# Patient Record
Sex: Female | Born: 1970
Health system: Southern US, Community
[De-identification: ages and names within clinical notes are randomized; demographics above are authoritative.]

## PROBLEM LIST (undated history)

## (undated) DIAGNOSIS — F32A Depression, unspecified: Secondary | ICD-10-CM

## (undated) DIAGNOSIS — E785 Hyperlipidemia, unspecified: Secondary | ICD-10-CM

## (undated) DIAGNOSIS — K219 Gastro-esophageal reflux disease without esophagitis: Secondary | ICD-10-CM

## (undated) DIAGNOSIS — E079 Disorder of thyroid, unspecified: Secondary | ICD-10-CM

## (undated) DIAGNOSIS — F329 Major depressive disorder, single episode, unspecified: Secondary | ICD-10-CM

## (undated) HISTORY — DX: Hyperlipidemia, unspecified: E78.5

## (undated) HISTORY — DX: Gastro-esophageal reflux disease without esophagitis: K21.9

---

## 2000-03-14 ENCOUNTER — Ambulatory Visit (HOSPITAL_COMMUNITY): Admission: RE | Admit: 2000-03-14 | Discharge: 2000-03-14 | Payer: Self-pay | Admitting: Hematology & Oncology

## 2000-03-14 ENCOUNTER — Encounter: Payer: Self-pay | Admitting: Hematology & Oncology

## 2000-12-05 DIAGNOSIS — C801 Malignant (primary) neoplasm, unspecified: Secondary | ICD-10-CM

## 2000-12-05 HISTORY — PX: THYROIDECTOMY: SHX17

## 2000-12-05 HISTORY — DX: Malignant (primary) neoplasm, unspecified: C80.1

## 2001-10-29 ENCOUNTER — Other Ambulatory Visit: Admission: RE | Admit: 2001-10-29 | Discharge: 2001-10-29 | Payer: Self-pay | Admitting: *Deleted

## 2001-12-05 DIAGNOSIS — Z923 Personal history of irradiation: Secondary | ICD-10-CM

## 2001-12-05 HISTORY — DX: Personal history of irradiation: Z92.3

## 2002-01-01 ENCOUNTER — Encounter (INDEPENDENT_AMBULATORY_CARE_PROVIDER_SITE_OTHER): Payer: Self-pay | Admitting: *Deleted

## 2002-01-01 ENCOUNTER — Encounter: Payer: Self-pay | Admitting: Otolaryngology

## 2002-01-01 ENCOUNTER — Ambulatory Visit (HOSPITAL_COMMUNITY): Admission: RE | Admit: 2002-01-01 | Discharge: 2002-01-01 | Payer: Self-pay | Admitting: Otolaryngology

## 2002-01-04 ENCOUNTER — Other Ambulatory Visit: Admission: RE | Admit: 2002-01-04 | Discharge: 2002-01-04 | Payer: Self-pay | Admitting: Otolaryngology

## 2002-01-07 ENCOUNTER — Encounter: Payer: Self-pay | Admitting: Otolaryngology

## 2002-01-07 ENCOUNTER — Ambulatory Visit (HOSPITAL_COMMUNITY): Admission: RE | Admit: 2002-01-07 | Discharge: 2002-01-07 | Payer: Self-pay | Admitting: Otolaryngology

## 2002-01-22 ENCOUNTER — Encounter (INDEPENDENT_AMBULATORY_CARE_PROVIDER_SITE_OTHER): Payer: Self-pay

## 2002-01-22 ENCOUNTER — Observation Stay (HOSPITAL_COMMUNITY): Admission: RE | Admit: 2002-01-22 | Discharge: 2002-01-23 | Payer: Self-pay | Admitting: Surgery

## 2002-02-11 ENCOUNTER — Ambulatory Visit (HOSPITAL_COMMUNITY): Admission: RE | Admit: 2002-02-11 | Discharge: 2002-02-11 | Payer: Self-pay | Admitting: Endocrinology

## 2002-02-14 ENCOUNTER — Encounter: Payer: Self-pay | Admitting: Endocrinology

## 2002-02-14 ENCOUNTER — Ambulatory Visit (HOSPITAL_COMMUNITY): Admission: RE | Admit: 2002-02-14 | Discharge: 2002-02-14 | Payer: Self-pay | Admitting: Endocrinology

## 2002-09-02 ENCOUNTER — Encounter (HOSPITAL_COMMUNITY): Admission: RE | Admit: 2002-09-02 | Discharge: 2002-12-01 | Payer: Self-pay | Admitting: Endocrinology

## 2002-09-06 ENCOUNTER — Encounter: Payer: Self-pay | Admitting: Endocrinology

## 2002-09-16 ENCOUNTER — Ambulatory Visit (HOSPITAL_COMMUNITY): Admission: RE | Admit: 2002-09-16 | Discharge: 2002-09-16 | Payer: Self-pay | Admitting: Endocrinology

## 2002-09-16 ENCOUNTER — Encounter: Payer: Self-pay | Admitting: Endocrinology

## 2003-02-10 ENCOUNTER — Other Ambulatory Visit: Admission: RE | Admit: 2003-02-10 | Discharge: 2003-02-10 | Payer: Self-pay | Admitting: *Deleted

## 2003-09-24 ENCOUNTER — Encounter (INDEPENDENT_AMBULATORY_CARE_PROVIDER_SITE_OTHER): Payer: Self-pay | Admitting: Specialist

## 2003-09-24 ENCOUNTER — Ambulatory Visit (HOSPITAL_COMMUNITY): Admission: RE | Admit: 2003-09-24 | Discharge: 2003-09-24 | Payer: Self-pay | Admitting: Obstetrics and Gynecology

## 2003-12-18 ENCOUNTER — Other Ambulatory Visit: Admission: RE | Admit: 2003-12-18 | Discharge: 2003-12-18 | Payer: Self-pay | Admitting: Obstetrics and Gynecology

## 2004-03-05 ENCOUNTER — Ambulatory Visit (HOSPITAL_COMMUNITY): Admission: RE | Admit: 2004-03-05 | Discharge: 2004-03-05 | Payer: Self-pay | Admitting: Obstetrics and Gynecology

## 2004-09-14 ENCOUNTER — Observation Stay (HOSPITAL_COMMUNITY): Admission: AD | Admit: 2004-09-14 | Discharge: 2004-09-15 | Payer: Self-pay | Admitting: Obstetrics and Gynecology

## 2004-10-02 ENCOUNTER — Inpatient Hospital Stay (HOSPITAL_COMMUNITY): Admission: AD | Admit: 2004-10-02 | Discharge: 2004-10-02 | Payer: Self-pay | Admitting: Obstetrics and Gynecology

## 2004-10-03 ENCOUNTER — Inpatient Hospital Stay (HOSPITAL_COMMUNITY): Admission: AD | Admit: 2004-10-03 | Discharge: 2004-10-03 | Payer: Self-pay | Admitting: Obstetrics and Gynecology

## 2004-10-06 ENCOUNTER — Inpatient Hospital Stay (HOSPITAL_COMMUNITY): Admission: AD | Admit: 2004-10-06 | Discharge: 2004-10-08 | Payer: Self-pay | Admitting: Obstetrics and Gynecology

## 2005-01-28 ENCOUNTER — Other Ambulatory Visit: Admission: RE | Admit: 2005-01-28 | Discharge: 2005-01-28 | Payer: Self-pay | Admitting: Obstetrics and Gynecology

## 2005-06-20 ENCOUNTER — Encounter: Admission: RE | Admit: 2005-06-20 | Discharge: 2005-06-20 | Payer: Self-pay | Admitting: Obstetrics and Gynecology

## 2005-07-06 ENCOUNTER — Ambulatory Visit (HOSPITAL_COMMUNITY): Admission: RE | Admit: 2005-07-06 | Discharge: 2005-07-06 | Payer: Self-pay | Admitting: Obstetrics and Gynecology

## 2005-08-15 ENCOUNTER — Ambulatory Visit: Payer: Self-pay | Admitting: Oncology

## 2006-02-01 ENCOUNTER — Ambulatory Visit: Payer: Self-pay | Admitting: Oncology

## 2006-02-02 ENCOUNTER — Other Ambulatory Visit: Admission: RE | Admit: 2006-02-02 | Discharge: 2006-02-02 | Payer: Self-pay | Admitting: Obstetrics and Gynecology

## 2006-04-21 ENCOUNTER — Emergency Department (HOSPITAL_COMMUNITY): Admission: EM | Admit: 2006-04-21 | Discharge: 2006-04-21 | Payer: Self-pay | Admitting: Family Medicine

## 2006-06-02 ENCOUNTER — Ambulatory Visit: Payer: Self-pay | Admitting: Oncology

## 2006-06-02 LAB — CBC WITH DIFFERENTIAL/PLATELET
Eosinophils Absolute: 0.1 10*3/uL (ref 0.0–0.5)
MONO#: 0.6 10*3/uL (ref 0.1–0.9)
NEUT#: 4.9 10*3/uL (ref 1.5–6.5)
RBC: 4.59 10*6/uL (ref 3.70–5.32)
RDW: 12.8 % (ref 11.3–14.5)
WBC: 7.4 10*3/uL (ref 3.9–10.0)
lymph#: 1.8 10*3/uL (ref 0.9–3.3)

## 2006-06-02 LAB — COMPREHENSIVE METABOLIC PANEL
AST: 16 U/L (ref 0–37)
Alkaline Phosphatase: 64 U/L (ref 39–117)
BUN: 10 mg/dL (ref 6–23)
Creatinine, Ser: 0.84 mg/dL (ref 0.40–1.20)
Total Bilirubin: 0.5 mg/dL (ref 0.3–1.2)

## 2006-07-17 ENCOUNTER — Ambulatory Visit (HOSPITAL_COMMUNITY): Admission: RE | Admit: 2006-07-17 | Discharge: 2006-07-17 | Payer: Self-pay

## 2006-09-27 ENCOUNTER — Encounter: Admission: RE | Admit: 2006-09-27 | Discharge: 2006-09-27 | Payer: Self-pay | Admitting: Obstetrics and Gynecology

## 2006-11-07 ENCOUNTER — Ambulatory Visit: Payer: Self-pay | Admitting: Oncology

## 2006-11-13 LAB — THYROGLOBULIN PANEL: Antithyroglobulin Ab: 2.4 IU/mL (ref 0.0–14.4)

## 2006-11-15 LAB — THYROGLOBULIN ANTIBODY: Thyroglobulin Ab: <30 U/mL (ref 0.0–60.0)

## 2007-01-29 ENCOUNTER — Encounter: Admission: RE | Admit: 2007-01-29 | Discharge: 2007-01-29 | Payer: Self-pay | Admitting: Internal Medicine

## 2007-04-04 ENCOUNTER — Emergency Department (HOSPITAL_COMMUNITY): Admission: EM | Admit: 2007-04-04 | Discharge: 2007-04-04 | Payer: Self-pay | Admitting: Family Medicine

## 2007-07-12 ENCOUNTER — Ambulatory Visit: Payer: Self-pay | Admitting: Oncology

## 2007-07-15 LAB — THYROGLOBULIN PANEL
Antithyroglobulin Ab: 2.3 IU/mL (ref 0.0–14.4)
Thyroglobulin: 2.1 ng/mL (ref 1.3–31.8)

## 2007-07-28 ENCOUNTER — Inpatient Hospital Stay (HOSPITAL_COMMUNITY): Admission: AD | Admit: 2007-07-28 | Discharge: 2007-07-28 | Payer: Self-pay | Admitting: Obstetrics and Gynecology

## 2007-09-16 ENCOUNTER — Inpatient Hospital Stay (HOSPITAL_COMMUNITY): Admission: AD | Admit: 2007-09-16 | Discharge: 2007-09-16 | Payer: Self-pay | Admitting: Obstetrics and Gynecology

## 2007-11-28 ENCOUNTER — Ambulatory Visit: Payer: Self-pay | Admitting: Oncology

## 2007-11-28 LAB — TSH: TSH: 1.19 u[IU]/mL (ref 0.350–5.500)

## 2008-03-04 ENCOUNTER — Inpatient Hospital Stay (HOSPITAL_COMMUNITY): Admission: AD | Admit: 2008-03-04 | Discharge: 2008-03-06 | Payer: Self-pay | Admitting: Obstetrics and Gynecology

## 2008-06-09 ENCOUNTER — Ambulatory Visit: Payer: Self-pay | Admitting: Oncology

## 2008-06-14 LAB — THYROGLOBULIN PANEL: Thyroglobulin: 1.2 ng/mL — ABNORMAL LOW (ref 1.3–31.8)

## 2008-08-26 ENCOUNTER — Encounter: Admission: RE | Admit: 2008-08-26 | Discharge: 2008-08-26 | Payer: Self-pay | Admitting: Oncology

## 2009-03-25 ENCOUNTER — Ambulatory Visit: Payer: Self-pay | Admitting: Oncology

## 2009-03-27 LAB — THYROGLOBULIN PANEL
Antithyroglobulin Ab: <20 IU/mL (ref <20)
Thyroglobulin: 2.7 ng/mL (ref 2.0–35.0)

## 2009-03-27 LAB — TSH: TSH: 0.008 u[IU]/mL — ABNORMAL LOW (ref 0.350–4.500)

## 2009-09-21 IMAGING — US US OB COMP LESS 14 WK
1 series · 14 of 17 positions shown · non-contrast
Comparison: 07/28/2007

CLINICAL DATA: 36-year-old female with positive pregnancy test.  Estimated gestation of 13 weeks 1 day. Bleeding and gush of fluid. 
 OBSTETRICAL ULTRASOUND <14 WKS AND TRANSVAGINAL OB US:
TECHNIQUE: Both transabdominal and transvaginal ultrasound examinations were performed for complete evaluation of the gestation as well as the maternal uterus, adnexal regions, and pelvic cul-de-sac.

[Series 1: us ob comp less 14 wks · 14 of 17 slices shown]
[im 1/17]
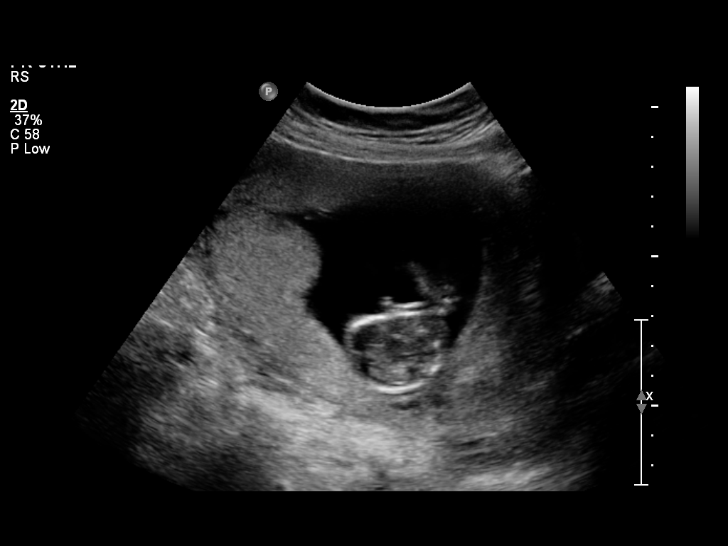
[im 2/17]
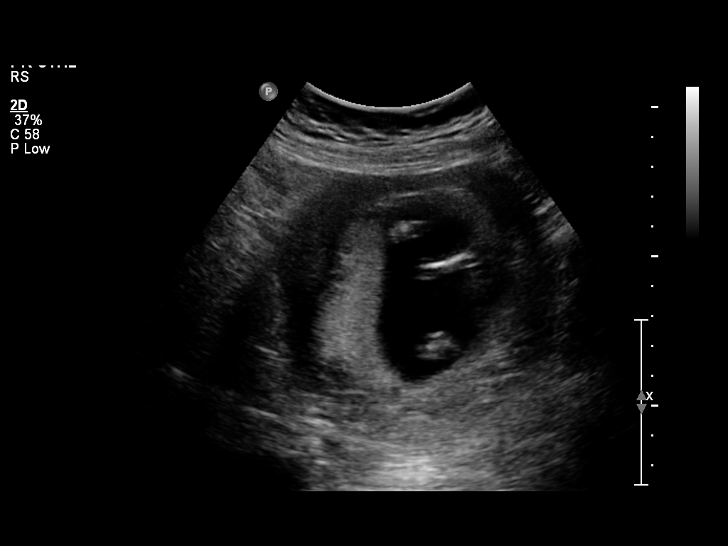
[im 4/17]
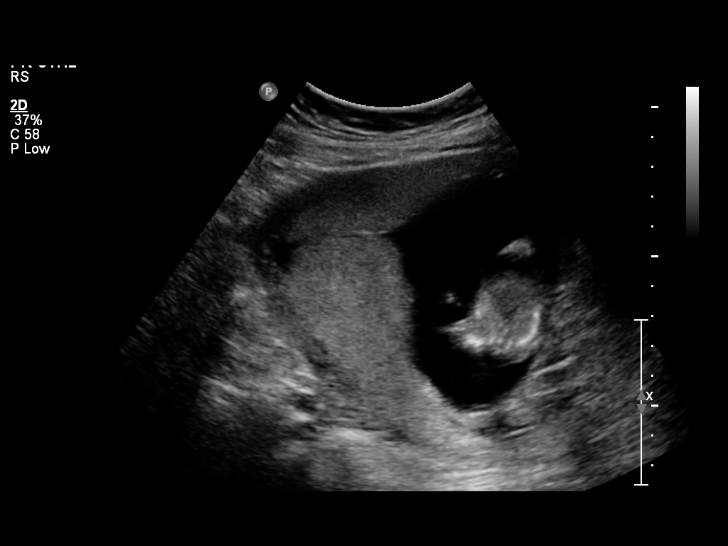
[im 5/17]
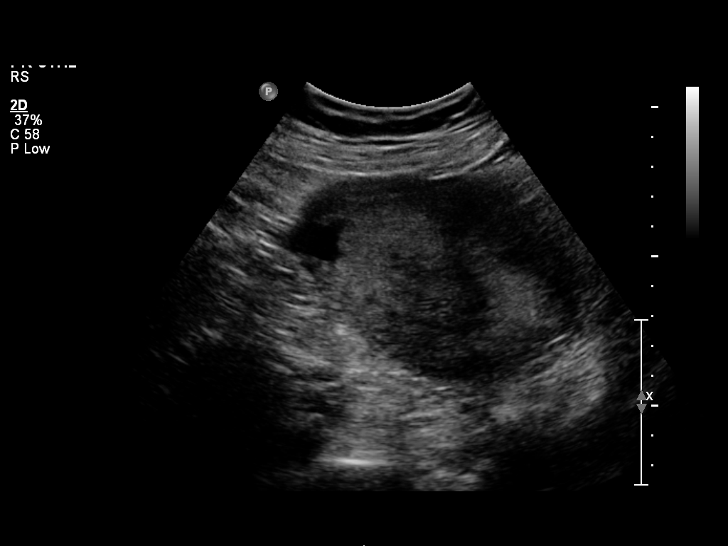
[im 6/17]
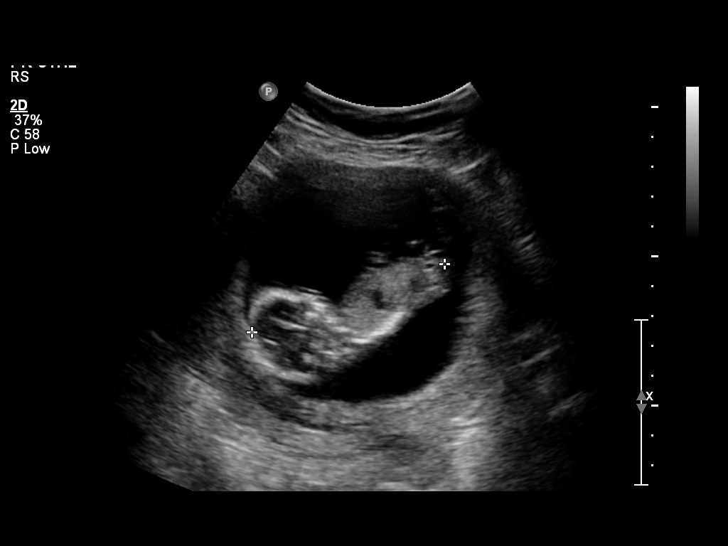
[im 7/17]
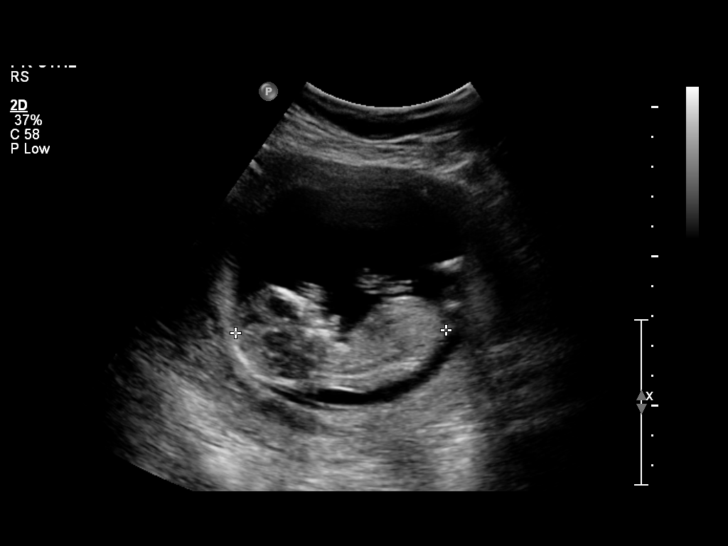
[im 8/17]
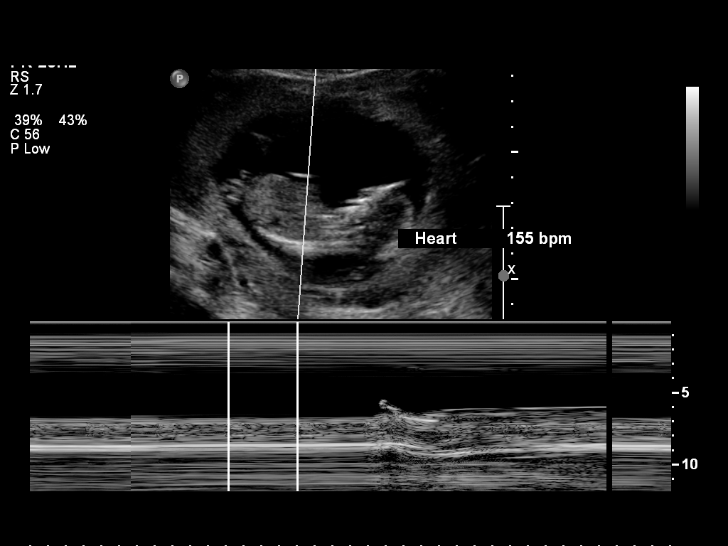
[im 10/17]
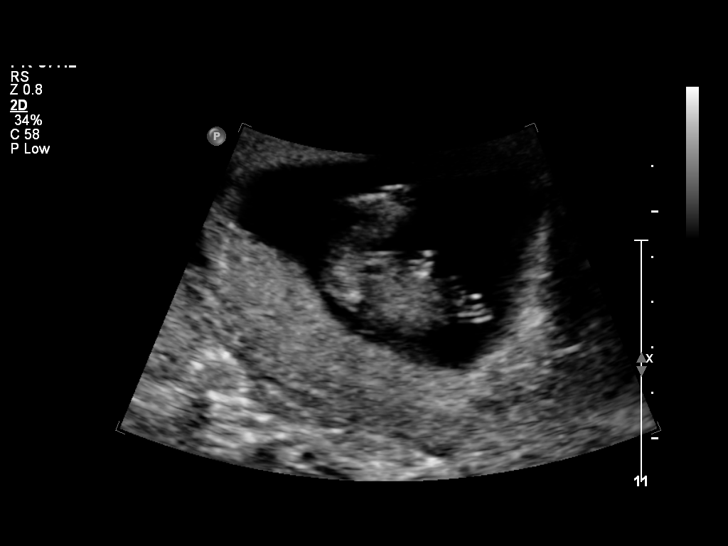
[im 11/17]
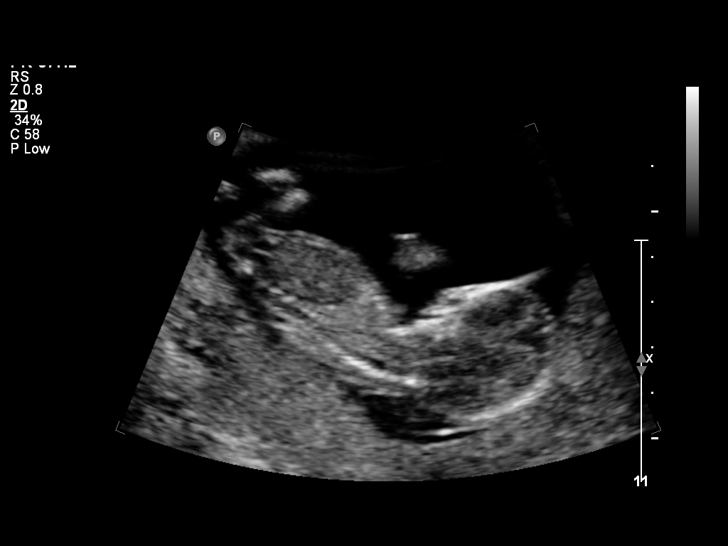
[im 12/17]
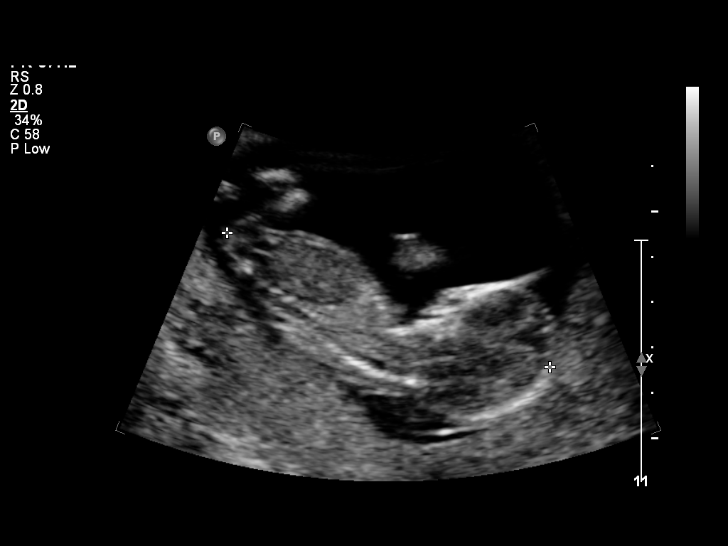
[im 13/17]
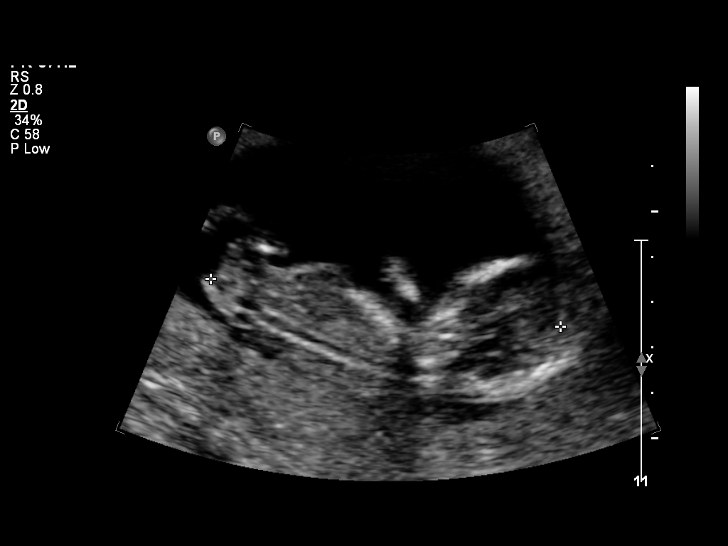
[im 14/17]
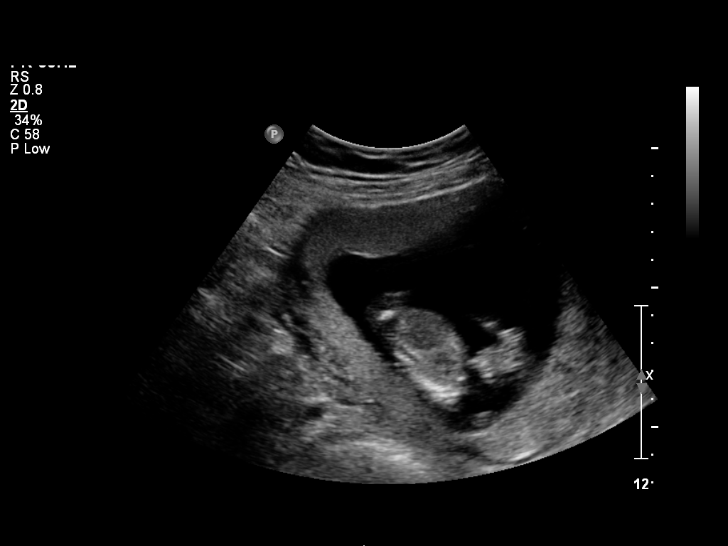
[im 16/17]
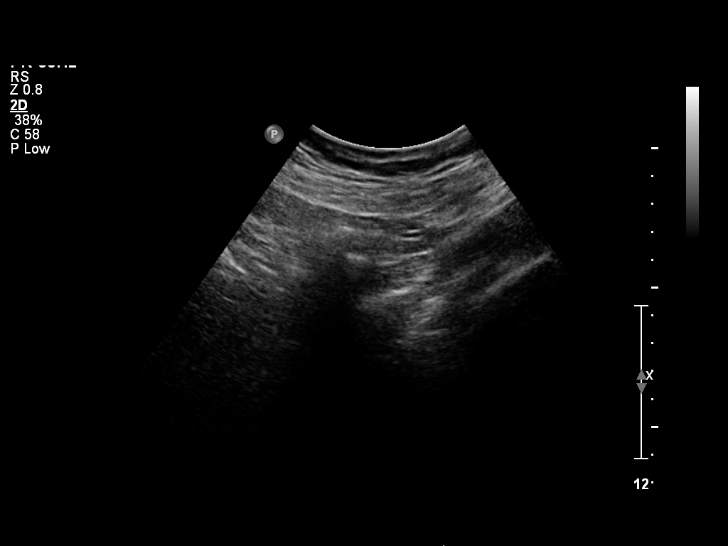
[im 17/17]
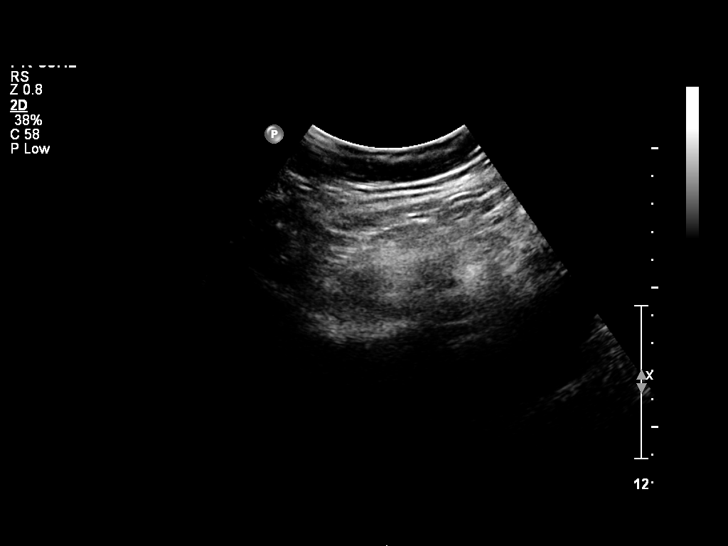

[14 of 17 positions shown; findings below may reference images not displayed]

FINDINGS: There is a single living intrauterine gestation with crown rump length measuring 7.34 cm corresponding to a gestational age of 13 weeks 4 days.  Estimated gestation by first ultrasound is 13 weeks 2 days.  Fetal cardiac activity measured 155 beats per minute.  Amniotic fluid volume is subjectively normal.  Small subchorionic hemorrhage is noted.  Ovaries bilaterally are not visualized.  There is no evidence of free fluid or adnexal masses.
IMPRESSION: 1.    Single living intrauterine gestation with assigned gestational age of 13 weeks 2 days and estimated gestational age by this ultrasound of 13 weeks 4 days. 
   Small subchorionic      hemorrhage.
  Ovaries      not visualized.

## 2009-09-24 ENCOUNTER — Emergency Department (HOSPITAL_COMMUNITY): Admission: EM | Admit: 2009-09-24 | Discharge: 2009-09-24 | Payer: Self-pay | Admitting: Emergency Medicine

## 2009-10-27 ENCOUNTER — Encounter: Admission: RE | Admit: 2009-10-27 | Discharge: 2009-10-27 | Payer: Self-pay | Admitting: Oncology

## 2010-07-06 ENCOUNTER — Ambulatory Visit: Payer: Self-pay | Admitting: Oncology

## 2010-12-16 ENCOUNTER — Encounter
Admission: RE | Admit: 2010-12-16 | Discharge: 2010-12-16 | Payer: Self-pay | Source: Home / Self Care | Attending: Oncology | Admitting: Oncology

## 2010-12-27 ENCOUNTER — Encounter: Payer: Self-pay | Admitting: Oncology

## 2011-03-10 LAB — POCT RAPID STREP A (OFFICE): Streptococcus, Group A Screen (Direct): POSITIVE — AB

## 2011-04-19 NOTE — H&P (Signed)
Michelle Browning, Michelle Browning             ACCOUNT NO.:  [id number]   MEDICAL RECORD NO.:  [id number]          PATIENT TYPE:  INP   LOCATION:  9160                          [facility]   PHYSICIAN:  Hal Morales, M.D.DATE OF BIRTH:  [birth year]   DATE OF ADMISSION:  03/04/2008  DATE OF DISCHARGE:                              HISTORY & PHYSICAL   PAST MEDICAL HISTORY:  This is a 40 year old, gravida 3, para 1-0-1-1,  at 54 and 3/7 weeks who presents unannounced with contractions which got  stronger on the way here.  She denies leaking or bleeding and reports  positive fetal movement.  Pregnancy has been followed by Dr. 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   ALLERGIES:  None.   OB HISTORY:  Remarkable for a spontaneous abortion in 2004 at 6 weeks  and a vaginal delivery in 2005 of female infant at [redacted] weeks gestation  weighing 7 pounds and 6 ounces.   MEDICAL HISTORY:  Remarkable for rubella and varicella vaccines and  history of papillary thyroid cancer in 2003 for which she had a  thyroidectomy done and now takes Synthroid for replacement.  She had a  history of anemia and GERD.   SURGICAL HISTORY:  Remarkable for thyroidectomy in 2003.   FAMILY HISTORY:  Remarkable for grandfather with MI, sister with  varicose veins, several family members with cancer and sister, uncle and  mother with depression.   GENETIC HISTORY:  Negative.   SOCIAL HISTORY:  The patient is married to [employer] who is involved  and supportive.  She is of the [employer].  She denies any alcohol,  tobacco or drug use.  She works as a Sports coach.   PRENATAL LABS:  Hemoglobin 13.5, platelets 192.  Blood type O+.  Antibody screen negative.  RPR nonreactive.  Rubella immune.  Hepatitis  negative.  HIV negative.   Gonorrhea negative.  Chlamydia negative.  Cystic fibrosis negative.   HISTORY OF CURRENT PREGNANCY:  The patient entered care at [redacted] weeks  gestation.  She had some first trimester cramping that resolved.  TSH at  16 weeks was 2.8.  She had some first trimester spotting.  She had a  first trimester ultrasound with a normal nuchal translucency.  She had  some more bleeding at 14 weeks and ultrasound showed anterior placenta  previa with subchorionic hemorrhage and she was placed on precautions  for that.  An 18-week ultrasound showed no placenta previa, but an  anterior succenturiate lobe to placenta.  Doppler studies were done  showing no vasa previa at 21 weeks.  Synthroid was increased to 0.112 at  25 weeks.  Ultrasound at 27 weeks was normal except for EFW of 97th  percentile.  She had an ultrasound at 33 weeks showing posterior  placenta with anterior succenturiate lobe and growth of greater than  90th percentile.  Cervix was normal and she was group B strep positive  at  term.   OBJECTIVE DATA:  VITAL SIGNS:  Stable.  Afebrile.  HEENT:  Within normal limits.  NECK:  Thyroid surgically absent.  CHEST:  Clear to auscultation.  ABDOMEN:  Gravid, vertex.  Leopold and EFM shows reassuring fetal heart  rate with contractions every 2-4 minutes.  Cervix is 3-4 cm, completely  effaced and zero station with vertex presentation and positive bloody  show.  EXTREMITIES:  Within normal limits.   ASSESSMENT:  1. Intrauterine pregnancy at 37 and 3/7 weeks.  2. Active labor, early.  3. Group B strep positive.   PLAN:  1. Admit per Dr. Pennie Rushing.  2. Routine MD orders.  3. Desires epidural.  4. Group B strep prophylaxis.      Marie L. Williams, C.N.M.      Hal Morales, M.D.  Electronically Signed    MLW/MEDQ  D:  03/04/2008  T:  03/04/2008  Job:  [id number]

## 2011-04-22 NOTE — Op Note (Signed)
[facility]  Patient:    Michelle Browning, Michelle Browning Visit Number: [id number] MRN: [id number]          Service Type: Attending:  Velora Heckler, M.D. Dictated by:   Velora Heckler, M.D. Proc. Date: 01/22/02   CC:         Genene Churn. Cyndie Chime, M.D.  Valentino Hue. Magrinat, M.D.  Gloris Manchester. Lazarus Salines, M.D.  Tera Mater. Evlyn Kanner, M.D.   Operative Report  PREOPERATIVE DIAGNOSES:  Right thyroid nodule, right cervical lymphadenopathy.  POSTOPERATIVE DIAGNOSES:  Right thyroid papillary carcinoma, right cervical lymphadenopathy.  PROCEDURE:  Total thyroidectomy with limited deep cervical lymph node dissection.  SURGEON:  Velora Heckler, M.D.  ASSISTANT:  Gita Kudo, M.D.  ANESTHESIA:  General.  ESTIMATED BLOOD LOSS:  Less than 50 cc.  PREPARATION:  Betadine.  COMPLICATIONS:  None.  INDICATIONS:  The patient is a 40 year old white female, who presents with a right thyroid nodule.  This is approximately 3 cm in size by ultrasound. Fine-needle aspiration shows atypical follicular cells worrisome for papillary carcinoma.  CT scan of the neck shows a 3.1 cm right thyroid mass with an adjacent 5 mm mass.  There is a 1.1 cm lymph node in the right internal jugular chain.  The patient now comes to surgery for thyroidectomy and limited left node dissection.  DESCRIPTION OF PROCEDURE:  The procedure is done in OR #11 at the [facility].  The patient is brought to the operating room and placed in a supine position on the operating room table.  Following administration of general anesthesia, the patient is positioned and then prepped and draped in the usual strict aseptic fashion.  After ascertaining that an adequate level of anesthesia had been obtained, a Kocher incision is made with a #10 blade. Dissection is carried down through the subcutaneous tissues and hemostasis obtained with the electrocautery.  Platysma is divided.  Skin flaps  are developed cephalad and caudad from the thyroid notch to the sternal notch.  A Mahorner self-retaining retractor is placed for exposure.  In the midline between the strap muscles, there is an obvious less than 1 cm lymph node. This was excised and submitted to pathology for permanent sectioning, labeled anterior cervical lymph node.  Next, the strap muscles are incised in the midline.  Dissection is begun on the left side.  The strap muscles are mobilized off the left thyroid lobe, allowing for palpation of the gland which appears grossly normal and palpates normal.  No abnormal lymphadenopathy is seen on the left side.  Next, we turned to the right side.  Strap muscles were reflected laterally.  The strap muscles are somewhat adherent to the capsule of the gland.  Given our suspicion of carcinoma, some of the muscle is resected with the underlying right thyroid lobe.  Lobe is mobilized.  Strap muscles are reflected laterally.  Dissection is continued.  Superior pole is mobilized.  Superior pole vessels are ligated in continuity with 2-0 silk ligatures and medium Ligaclips.  They are divided.  The gland is rolled anteriorly.  Middle thyroid vein is divided between small Ligaclips.  Gland is rolled further anteriorly.  Both the superior and inferior parathyroid on the right side are identified and preserved.  Small venous tributaries are divided between small Ligaclips.  Inferior thyroid artery is dissected out at the level of the thyroid capsule, divided between small Ligaclips.  Inferior thyroid veins are divided between small Ligaclips.  Gland is rolled anteriorly up and  onto the anterior surface of the trachea.  Recurrent laryngeal nerve is identified and preserved.  Thyroid is mobilized down to the ligament of Berry. This was transected carefully using the electrocautery.  A small remnant of thyroid tissue is left immediately adjacent to the recurrent laryngeal nerve. Gland is  mobilized across the midline.  There is no pyramidal lobe.  The dissection is then continued on the left side.  Again, superior pole vessels are ligated in continuity with 2-0 silk ligatures, followed by medium Ligaclips, and then divided.  Gland is rolled anteriorly.  Again, both the superior and inferior parathyroid glands are identified and preserved.  Venous tributaries are divided between small Ligaclips.  Branches of the inferior thyroid artery are divided between small Ligaclips.  Recurrent laryngeal nerve is identified and preserved.  Again, dissection is carried down to the ligament of Berry.  This is transected with the electrocautery, again leaving a small remnant of thyroid tissue immediately adjacent to the recurrent laryngeal nerve.  Gland is excised off the trachea.  Right superior pole is marked with a ligature.  The gland is submitted fresh to pathology where the pathologist did a frozen section, confirming papillary thyroid carcinoma. Good hemostasis is noted in the neck.  A left neck lymph node is excised and submitted as separate specimen.  There is a palpable high digastric lymph node on the right side.  This was approached through the cervical wound.  It is mobilized.  Inflow through the lymph node is controlled with medium Ligaclip. A second smaller, soft lymph node just above the palpable lymph node is also included with the specimen in fragments.  It appears grossly normal.  Good hemostasis is noted.  Palpitation throughout the right neck reveals no further significant adenopathy.  The anterior compartment of the lymph nodes overlying the trachea are excised using the electrocautery and small Ligaclips for hemostasis.  These are submitted, labeled as anterior compartment lymph nodes.  Good hemostasis is obtained throughout the neck.  Surgicel is placed over the area of the recurrent laryngeal nerves bilaterally.  A small piece of Surgicel is also placed at the site  of the right digastric lymph node.  Strap muscles are reapproximated in the midline with interrupted 3-0 Vicryl sutures. Platysma is reapproximated with interrupted 3-0 Vicryl sutures.  Skin edges are reapproximated with widely spaced stainless steel staples and interspaced Benzoin and Steri-Strips.  Sterile gauze dressings are applied.  The patient is awakened from anesthesia and brought to the recovery room in stable condition.  The patient tolerated the procedure well. Dictated by:   Velora Heckler, M.D. Attending:  Velora Heckler, M.D. DD:  01/22/02 TD:  01/22/02 Job: [id number]

## 2011-04-22 NOTE — H&P (Signed)
Michelle Browning, Michelle Browning NO.:  [id number]   MEDICAL RECORD NO.:  [id number]          PATIENT TYPE:  MAT   LOCATION:  MATC                          [facility]ILITY:  [facility]   PHYSICIAN:  Osborn Coho, M.D.   DATE OF BIRTH:  Nov 21, 1971   DATE OF ADMISSION:  09/14/2004  DATE OF DISCHARGE:                                HISTORY & PHYSICAL   HISTORY OF PRESENT ILLNESS:  This is a 40 year old, gravida 2, para 0-0-1-0,  at 68 and 0/7ths weeks who presents status post a fall at work at 1400  hours.  She slipped on a wet floor and landed on her left knee.  She denies  leaking, bleeding, or abdominal pain, and reports positive fetal movement.  The pregnancy has been followed by Dr. Estanislado Pandy and remarkable for (1)  papillary thyroid cancer status post thyroidectomy, (2) exposure to fifth  disease and Coxsackie virus.   OBSTETRICAL HISTORY:  Remarkable for a spontaneous abortion in 2004.   MEDICAL HISTORY:  1.  Remarkable for history of HSV-1.  2.  Childhood varicella.  3.  History of anemia.  4.  She was diagnosed with papillary thyroid cancer in 2003, and had a      thyroidectomy, but still has traces of cancer in her thyroid area.  She      also had radioactive iodine treatments after her surgery x2, and takes      Synthroid replacement.  5.  She also has a history of GERD.  6.  History of urinary tract infections.   FAMILY HISTORY:  Remarkable for grandfather with a heart attack.  Sister  with varicose veins.  Two aunts with lung cancer.  Grandmother with skin  cancer.  An aunt with breast cancer.  Sister, uncle, and mother with  depression.   GENETIC HISTORY:  Unremarkable.   SOCIAL HISTORY:  The patient is married to [employer], who is involved  and supportive.  She works as a Sports coach at [employer].  She is of the  [employer].  She denies any alcohol, tobacco, or drug use.   PRENATAL LABORATORIES:  Hemoglobin 12.9, platelets 190, blood type O  positive,  antibody screen negative, rubella immune, hepatitis negative, HIV  negative, Pap test normal, gonorrhea negative, Chlamydia negative, cystic  fibrosis negative.  TSH in March was 0.053, and medication was adjusted.  She had Coxsackie virus titers, which were elevated.  Glucola was normal.  The most recent ultrasound shows growth at 85th percentile, and that was at  32 weeks.   ALLERGIES:  None.   MEDICATIONS:  1.  Synthroid.  2.  Prenatal vitamins.   OBJECTIVE DATA:  VITAL SIGNS:  Stable, afebrile.  HEENT:  Within normal limits.  Thyroid surgically absent.  CHEST:  Clear to auscultation.  HEART:  Heart rate regular rate and rhythm.  ABDOMEN:  Soft and nontender.  EFM shows a reactive fetal heart rate with  uterine contractions every 2-5 minutes, which are mild.  PELVIC:  Cervix was closed, 80%, 0 station, with a vertex presentation.  There is no vaginal bleeding or leaking of fluid.  EXTREMITIES:  Left knee is slightly bruised just below the patella at the  proximal tibial area, but the patient does have full range of motion and no  evidence of fracture.  There is no edema or erythema noted.   ASSESSMENT:  1.  Intrauterine pregnancy at 38 and 0/7ths weeks.  2.  Status post fall with bruised left knee.  3.  Uterine contractions, probable Deberah Pelton, but cannot exclude      abruptio until 24 hours.   PLAN:  1.  Admit to antenatal per Dr. Su Hilt.  2.  Bed rest with bathroom privileges.  3.  Ultrasound to check placenta and AFI.  4.  Continuous fetal monitoring.  5.  Further orders to follow.      MLW/MEDQ  D:  09/14/2004  T:  09/14/2004  Job:  [id number]

## 2011-04-22 NOTE — Discharge Summary (Signed)
Michelle Browning, Michelle Browning             ACCOUNT NO.:  [id number]   MEDICAL RECORD NO.:  [id number]          PATIENT TYPE:  INP   LOCATION:  9159                           FACILITY:   PHYSICIAN:  Naima A. Dillard, M.D. DATE OF BIRTH:  1971-04-11   DATE OF ADMISSION:  09/14/2004  DATE OF DISCHARGE:  09/15/2004                                 DISCHARGE SUMMARY   ADMISSION DIAGNOSES:  1.  Intrauterine pregnancy at 35 weeks.  2.  Status post fall with bruised left knee.  3.  Uterine contractions.   DISCHARGE DIAGNOSES:  1.  Intrauterine pregnancy at 35 weeks.  2.  Status post fall with bruised left knee.  3.  Uterine contractions.   PROCEDURE:  None.   HOSPITAL COURSE:  Michelle Browning is a 40 year old, gravida 3, para 2-0-1-0 at  35 weeks who fell at work at approximately 2:00 p.m. on September 14, 2004.  She slipped on a wet floor and landed on her left knee.  Her pregnancy had  been remarkable for:  1.  Papillary thyroid cancer, status post thyroidectomy.  2.  Exposure to fifth disease and Coxsackie virus.   On admission, the patient was noted to have normal vital signs.  Fetal heart  rate was reactive with uterine contractions every 2-5 minutes.  The cervix  was closed, 80%, 0 station with vertex presentation.  She had a bruise on  her left knee just below the patella at the proximal tibial area.  The  patient did have a full range of motion, no evidence of fracture.  No edema  or erythema noted.  She was admitted for overnight observation.  Ultrasound  was done with normal findings.  Continuous fetal monitoring was performed.  Ultrasound showed a normal anterior placenta, normal fluid.  No placental  abnormality ws noted.  The cervix was 2.3 cm trans vaginally and the fetus  was in a vertex presentation.  Through the night, the patient continued to  have very occasional contractions but no pain.  By the morning of September 15, 2004 she was doing well.  She was up ad lib.  She had some  minimal  soreness in her left knee, positive fetal movement was noted.  Fasting blood  sugar was 85.  TSH was pending.  Fetal heart rate was reactive with no  decelerations.  There were 3-5 contractions per hour but mild.  The patient  was seen by Dr. Normand Sloop and was deemed to have received full benefit of her  hospital stay and was discharged home.   DISCHARGE INSTRUCTIONS:  The patient is to continue to observe for any signs  of fetal difficulty including bleeding, pain or decreased fetal movement.  She is to take Tylenol for her knee issue should that be required.   DISCHARGE MEDICATIONS:  The patient is on Synthroid and prenatal vitamins.   DISCHARGE FOLLOW UP:  Will occur on September 22, 2004 at [facility]  or p.r.n.   CONDITION ON DISCHARGE:  Stable.      VLL/MEDQ  D:  09/15/2004  T:  09/15/2004  Job:  [id number]

## 2011-04-22 NOTE — H&P (Signed)
NAME:  Michelle Browning, Michelle Browning NO.:  [id number]   MEDICAL RECORD NO.:  [id number]          PATIENT TYPE:  INP   LOCATION:  9169                          [facility]ILITY[facility]   PHYSICIAN:  Osborn Coho, M.D.   DATE OF BIRTH:  02/22/1971   DATE OF ADMISSION:  10/06/2004  DATE OF DISCHARGE:                                HISTORY & PHYSICAL   HISTORY OF PRESENT ILLNESS:  Michelle Browning is a 40 year old married white  female gravida 2, para 0-0-1-0 at 38-1/7 weeks who presents complaining of  uterine contractions every 3-5 minutes since about 11 p.m.  She denies any  lesion or vaginal bleeding.  She reports positive fetal movement.  She  denies any nausea, vomiting, headache or visual disturbances.  Her pregnancy  has been followed at [facility] by the [facility] and has been  essentially uncomplicated, though at risk for history of papillary thyroid  cancer that was treated with radioactive iodine and has subsequently  required her to take Synthroid.  She takes 112 mcg daily of Synthroid.  Her  Group B strep is negative.  She also had a possible exposure to [place]  virus early in this pregnancy, but her titers showed no recent exposure.   OBSTETRICS/GYNECOLOGY HISTORY:  She is a gravida 2, para 0-0-1-1.  Had a  miscarriage in October 2004 that required a D&E.  Her last menstrual period  for this pregnancy was January 13, 2004, giving her an [facility] of October 18, 2004, confirmed by ultrasound.  She has a history of HSV-1 but no history of  HSV-2 and no recent outbreaks or cold sores.  History of yeast infections as  well.   ALLERGIES:  No known drug allergies.   PAST MEDICAL HISTORY:  1.  Usual childhood diseases.  2.  History of anemia.  3.  History of gastroesophageal reflux disease.  4.  Occasional urinary tract infection.   PAST SURGICAL HISTORY:  Thyroidectomy in February 2003 due to cancer,  followed by two doses of radioactive iodine.  She reports she  still  apparently has traces of cancer in the thyroid area.   FAMILY HISTORY:  Significant for paternal grandfather with heart attacks,  sister with varicose veins, aunts with lung cancer/skin cancer/breast  cancer.  Maternal grandfather was an alcoholic.   SOCIAL HISTORY:  She is married to [employer] who is involved and  supportive.  She is employed as a Sports coach at [employer], and he is employed at [employer], both of them full  time.  They are of the [employer].  They deny any illicit drug use,  alcohol or smoking with this pregnancy.   PRENATAL LABORATORIES:  Blood type is O positive, antibody screen negative,  syphilis is nonreactive, rubella positive, hepatitis B surface antigen  negative, HIV nonreactive, GC/Chlamydia both negative, Pap within normal  limits in January 2004, cystic fibrosis is negative, thyroid within normal  limits per the patient, Group B strep at 36 weeks is negative.  One hour  Glucola was 93.   PHYSICAL EXAMINATION:  VITAL SIGNS:  Stable, afebrile.  HEENT:  Grossly within normal limits.  HEART:  Regular rate and rhythm.  CHEST:  Clear.  BREASTS:  Soft and nontender.  ABDOMEN:  Gravid with uterine contractions every 4-5 minutes.  Fetal heart  rate is reactive and reassuring.  Pelvic exam is 3-4 cm, 90% vertex with 0  station of intact membrane.  She was 2 cm earlier in the office today.  EXTREMITIES:  Within normal limits.   ASSESSMENT:  1.  Intrauterine pregnancy at [redacted] weeks gestation.  2.  Early labor.  3.  Negative Group B strep.  4.  History of thyroidectomy.   PLAN:  Admit to Labor and Delivery.  Follow routine MD orders.  Notify Dr.  Su Hilt when patient's admission is complete.     Shel   SJD/MEDQ  D:  10/06/2004  T:  10/06/2004  Job:  [id number]

## 2011-04-22 NOTE — H&P (Signed)
   NAME:  Michelle Browning, [place]                       ACCOUNT NO.:  [id number]   MEDICAL RECORD NO.:  [id number]                   PATIENT TYPE:  AMB   LOCATION:  SDC                                  [facility]   PHYSICIAN:  Duke Salvia. Marcelle Overlie, M.D.            DATE OF BIRTH:  1971/04/18   DATE OF ADMISSION:  09/24/2003  DATE OF DISCHARGE:                                HISTORY & PHYSICAL   CHIEF COMPLAINT:  Missed AB.   HISTORY OF PRESENT ILLNESS:  A 40 year old G1, P0 presented for ultrasound  in the office today, was noted to have a well formed fetal pole.  No FHR was  noted.  She has had a slow fetal heart rate in the 80-100 range over the  last two weeks.  She has not had any other symptoms.  She presents now for  D&E.  This procedure including risks of bleeding, infection, other  complications that may require additional surgery are all reviewed with her.   PAST MEDICAL HISTORY:   ALLERGIES:  None.   OPERATIONS:  None.   OBSTETRICAL HISTORY:  Unremarkable.   MEDICATIONS:  Synthroid every day, along with prenatal vitamins.   PHYSICAL EXAMINATION:  VITAL SIGNS:  Temp 98.2, blood pressure 120/62.  HEENT:  Unremarkable.  NECK:  Supple without masses.  LUNGS:  Clear.  CARDIOVASCULAR:  Regular, rate and rhythm without murmurs, rubs or gallops  noted.  BREASTS:  Without masses.  ABDOMEN:  Soft, flat, nontender.  PELVIC:  No external genitalia.  Vaginal and cervix are clear.  Uterus was 7-  week size, mid position.  Adnexa negative.   IMPRESSION:  Missed abortion.   PLAN:  D&E procedure and risks reviewed as above.                                               Richard M. Marcelle Overlie, M.D.    RMH/MEDQ  D:  09/24/2003  T:  09/24/2003  Job:  [id number]

## 2011-04-22 NOTE — Op Note (Signed)
   NAME:  Michelle Browning, Michelle Browning                       ACCOUNT NO.:  [id number]   MEDICAL RECORD NO.:  [id number]                   PATIENT TYPE:  AMB   LOCATION:  SDC                                  FACILITY:  [facility]   PHYSICIAN:  Duke Salvia. Marcelle Overlie, M.D.            DATE OF BIRTH:  June 21, 1971   DATE OF PROCEDURE:  09/24/2003  DATE OF DISCHARGE:                                 OPERATIVE REPORT   PREOPERATIVE DIAGNOSIS:  Missed abortion.   POSTOPERATIVE DIAGNOSIS:  Missed abortion.   PROCEDURE:  Dilatation and evacuation.   SURGEON:  Duke Salvia. Marcelle Overlie, M.D.   ANESTHESIA:  Sedation plus paracervical block.   PROCEDURE AND FINDINGS:  The patient was taken to the operating room.  After  an adequate level of sedation was obtained with the patient's legs in  stirrups, the perineum and vagina were prepped and draped in the usual  manner for D&E.  The bladder was drained, a UA carried out.  The uterus was  seven to eight weeks' size, midposition, adnexa negative.  The speculum was  positioned, cervix grasped with a tenaculum.  A paracervical block was then  created by infiltrating at 3 and 9 o'clock submucosally, Xylocaine 1% 5-7 mL  on either side after negative aspiration.  The uterus was then sounded to 8-  9 cm, was progressively dilated to a 27 Pratt dilator, a #7 curved suction  curette was then used to curette a moderate amount of tissue.  When no  further tissue could be removed, a small blunt curette was used to explore  the cavity, revealing it to be firm and clean.  She did receive Toradol at  the end of the case.  She tolerated this well, went to the recovery room in  good condition.                                               Richard M. Marcelle Overlie, M.D.    RM[facility]/MEDQ  D:  09/24/2003  T:  09/25/2003  Job:  [id number]

## 2011-08-29 LAB — CBC
MCV: 89.6
Platelets: 182
WBC: 16.4 — ABNORMAL HIGH

## 2011-08-29 LAB — RPR: RPR Ser Ql: NONREACTIVE

## 2011-08-30 LAB — CBC
HCT: 34.7 — ABNORMAL LOW
Hemoglobin: 11.9 — ABNORMAL LOW
MCHC: 34.5
MCV: 90.2
Platelets: 170
RDW: 13.7

## 2012-01-26 ENCOUNTER — Ambulatory Visit: Payer: Self-pay | Admitting: Family Medicine

## 2012-02-07 ENCOUNTER — Encounter: Payer: Self-pay | Admitting: Family Medicine

## 2012-02-07 ENCOUNTER — Ambulatory Visit (INDEPENDENT_AMBULATORY_CARE_PROVIDER_SITE_OTHER): Payer: 59 | Admitting: Family Medicine

## 2012-02-07 VITALS — BP 137/85 | HR 92 | Ht 66.5 in | Wt 174.0 lb

## 2012-02-07 DIAGNOSIS — Z283 Underimmunization status: Secondary | ICD-10-CM

## 2012-02-07 DIAGNOSIS — C73 Malignant neoplasm of thyroid gland: Secondary | ICD-10-CM

## 2012-02-07 DIAGNOSIS — F341 Dysthymic disorder: Secondary | ICD-10-CM

## 2012-02-07 DIAGNOSIS — Z2839 Other underimmunization status: Secondary | ICD-10-CM

## 2012-02-07 DIAGNOSIS — Z0000 Encounter for general adult medical examination without abnormal findings: Secondary | ICD-10-CM

## 2012-02-07 DIAGNOSIS — F419 Anxiety disorder, unspecified: Secondary | ICD-10-CM | POA: Insufficient documentation

## 2012-02-07 DIAGNOSIS — Z85850 Personal history of malignant neoplasm of thyroid: Secondary | ICD-10-CM

## 2012-02-07 DIAGNOSIS — F32A Depression, unspecified: Secondary | ICD-10-CM

## 2012-02-07 DIAGNOSIS — R5383 Other fatigue: Secondary | ICD-10-CM

## 2012-02-07 MED ORDER — LEVOTHYROXINE SODIUM 200 MCG PO TABS: 200.0000 ug | ORAL_TABLET | Freq: Every day | ORAL | Status: DC

## 2012-02-07 MED ORDER — BUPROPION HCL ER (XL) 300 MG PO TB24: 300.0000 mg | ORAL_TABLET | Freq: Every day | ORAL | Status: DC

## 2012-02-07 MED ORDER — ESCITALOPRAM OXALATE 20 MG PO TABS: 20.0000 mg | ORAL_TABLET | Freq: Every day | ORAL | Status: DC

## 2012-02-07 NOTE — Progress Notes (Addendum)
  Subjective:    Patient ID: Michelle Browning, female    DOB: 1971/08/26, 41 y.o.   MRN: [id number]  HPI [name] is here for followup of her thyroid cancer. Interesting she had a total thyroidectomy in 2002 for thyroid cancer at that time they removed parathyroid glands as well there were some thyroid tissue basically they thought between the larynx and the carotid also there was thought to be some thyroid tissue in the lungs as well. She was being followed by a endocrinologist oncologist who was doing PET scans and other imaging to follow this after she had what was thought at that time maximum radiation treatment. She's had 2 pregnancies since her diagnosis of thyroid cancer she's had a TSH levels checked but the oncologist that she was following left town about 6 years ago. She's really not had a endocrine oncologist see her since then.  #2 anxiety depression patient denies wanting to hurt herself or anyone but she's had a history of depression and anxiety for a number of years. She is on Wellbutrin XL 300 and Celexa 10 mg. She reports not sleeping at night looking at the ceiling came in I made her fall asleep at night decreased libido no energy and no strength to do new activity.  Review of Systems  Constitutional: Positive for appetite change and fatigue.       Wt gain  Psychiatric/Behavioral: Positive for sleep disturbance and dysphoric mood. Negative for suicidal ideas and self-injury. The patient is nervous/anxious.   All other systems reviewed and are negative.      BP 137/85  Pulse 92  Ht 5' 6.5" (1.689 m)  Wt 174 lb (78.926 kg)  BMI 27.66 kg/m2  SpO2 97%  LMP 12/07/2011 Objective:   Physical Exam  Constitutional: She is oriented to person, place, and time. She appears well-developed and well-nourished. No distress.  Eyes: Pupils are equal, round, and reactive to light.  Neck: Normal range of motion. Neck supple. No thyromegaly present.       Thyroid gland absent    Cardiovascular: Normal rate, regular rhythm and normal heart sounds.   Pulmonary/Chest: Effort normal and breath sounds normal. No respiratory distress.  Neurological: She is alert and oriented to person, place, and time.  Skin: Skin is warm and dry. She is not diaphoretic.  Psychiatric: Her mood appears anxious. Her affect is not labile and not inappropriate. Her speech is not rapid and/or pressured, not delayed and not slurred. She is not agitated, not aggressive, not slowed and not withdrawn. She exhibits a depressed mood.       Tearful at times   PH Cortical she placed 3 on the suppression doing things, falling and staying asleep time were no energy and poor appetite no bleeding for total score of 12. GAD score of 10.      Assessment & Plan:  #1 Thyroid cancer on current thyroid replacement Will try to get her in to endocrinologist oncologist. She had met w/a local oncologist before and wants if possible a referral to that oncologist.  #2 Anxiety and depression will continue w/Wellbutrin and will change from Celexa to Lexapro and explain why we will change those medication  #3 possible Dtap at work or here next visit, and Pe in 2 months when she returns

## 2012-02-07 NOTE — Patient Instructions (Signed)

## 2012-02-08 ENCOUNTER — Encounter: Payer: Self-pay | Admitting: Family Medicine

## 2012-02-08 LAB — CBC WITH DIFFERENTIAL/PLATELET
Basophils Relative: 0 % (ref 0–1)
Eosinophils Absolute: 0.1 10*3/uL (ref 0.0–0.7)
HCT: 42.8 % (ref 36.0–46.0)
Hemoglobin: 14.2 g/dL (ref 12.0–15.0)
Lymphs Abs: 2.7 10*3/uL (ref 0.7–4.0)
MCH: 28.9 pg (ref 26.0–34.0)
MCHC: 33.2 g/dL (ref 30.0–36.0)
Monocytes Absolute: 1 10*3/uL (ref 0.1–1.0)
Monocytes Relative: 8 % (ref 3–12)
RBC: 4.92 MIL/uL (ref 3.87–5.11)

## 2012-02-08 LAB — LIPID PANEL
LDL Cholesterol: 106 mg/dL — ABNORMAL HIGH (ref 0–99)
Triglycerides: 158 mg/dL — ABNORMAL HIGH (ref <150)
VLDL: 32 mg/dL (ref 0–40)

## 2012-02-08 LAB — COMPREHENSIVE METABOLIC PANEL
Albumin: 4.5 g/dL (ref 3.5–5.2)
Alkaline Phosphatase: 66 U/L (ref 39–117)
BUN: 9 mg/dL (ref 6–23)
CO2: 24 mEq/L (ref 19–32)
Glucose, Bld: 86 mg/dL (ref 70–99)
Potassium: 3.8 mEq/L (ref 3.5–5.3)
Sodium: 136 mEq/L (ref 135–145)
Total Bilirubin: 0.8 mg/dL (ref 0.3–1.2)
Total Protein: 7.4 g/dL (ref 6.0–8.3)

## 2012-02-08 LAB — VITAMIN B12: Vitamin B-12: 669 pg/mL (ref 211–911)

## 2012-02-08 LAB — TSH: TSH: 0.008 u[IU]/mL — ABNORMAL LOW (ref 0.350–4.500)

## 2012-02-08 LAB — HEMOGLOBIN A1C: Hgb A1c MFr Bld: 5.2 % (ref <5.7)

## 2012-03-14 ENCOUNTER — Other Ambulatory Visit: Payer: Self-pay | Admitting: Oncology

## 2012-03-14 DIAGNOSIS — C73 Malignant neoplasm of thyroid gland: Secondary | ICD-10-CM

## 2012-03-19 ENCOUNTER — Telehealth: Payer: Self-pay | Admitting: Oncology

## 2012-03-19 NOTE — Telephone Encounter (Signed)
called pt on 04/12 and 04/15 lmovm to rtn call to confirm appts for 04/23 pet scan and 04/24 new pt appt

## 2012-03-20 ENCOUNTER — Telehealth: Payer: Self-pay | Admitting: Oncology

## 2012-03-20 ENCOUNTER — Ambulatory Visit: Payer: 59 | Admitting: Family Medicine

## 2012-03-20 NOTE — Telephone Encounter (Signed)
called pt and scheduled appt for 04/29.  will send a letter to Dr. Ingram with appt d/t 

## 2012-03-27 ENCOUNTER — Inpatient Hospital Stay (HOSPITAL_COMMUNITY)
Admission: RE | Admit: 2012-03-27 | Discharge: 2012-03-27 | Payer: 59 | Source: Ambulatory Visit | Attending: Oncology | Admitting: Oncology

## 2012-03-27 ENCOUNTER — Other Ambulatory Visit (HOSPITAL_COMMUNITY): Payer: 59

## 2012-03-28 ENCOUNTER — Other Ambulatory Visit: Payer: 59 | Admitting: Lab

## 2012-03-28 ENCOUNTER — Ambulatory Visit: Payer: 59

## 2012-03-28 ENCOUNTER — Ambulatory Visit: Payer: 59 | Admitting: Oncology

## 2012-03-30 ENCOUNTER — Telehealth: Payer: Self-pay | Admitting: Oncology

## 2012-03-30 NOTE — Telephone Encounter (Signed)
called and emailed pt and infiormed her of ct and pet scan for 05/08 @ [facility] and md appt for 05/15.  will fax over a letter to Dr. Thurmond Butts with appt d/t

## 2012-04-03 ENCOUNTER — Other Ambulatory Visit: Payer: Self-pay | Admitting: *Deleted

## 2012-04-03 DIAGNOSIS — C73 Malignant neoplasm of thyroid gland: Secondary | ICD-10-CM

## 2012-04-06 ENCOUNTER — Telehealth: Payer: Self-pay | Admitting: *Deleted

## 2012-04-06 NOTE — Telephone Encounter (Signed)
Pt returned call from Wed & states that she had labs done at [facility] @ 4mo ago.  Requested that she call & have labs faxed to Dr. Cyndie Chime & will then check with Dr.Granfortuna to see if she need further labs.

## 2012-04-11 ENCOUNTER — Encounter (HOSPITAL_COMMUNITY)
Admission: RE | Admit: 2012-04-11 | Discharge: 2012-04-11 | Disposition: A | Discharge status: Home / Self Care | Payer: 59 | Source: Ambulatory Visit | Attending: Oncology | Admitting: Oncology

## 2012-04-11 ENCOUNTER — Ambulatory Visit (HOSPITAL_COMMUNITY)
Admission: RE | Admit: 2012-04-11 | Discharge: 2012-04-11 | Disposition: A | Discharge status: Home / Self Care | Payer: 59 | Source: Ambulatory Visit | Attending: Oncology | Admitting: Oncology

## 2012-04-11 DIAGNOSIS — C73 Malignant neoplasm of thyroid gland: Secondary | ICD-10-CM

## 2012-04-11 MED ORDER — FLUDEOXYGLUCOSE F - 18 (FDG) INJECTION
15.0000 | Freq: Once | INTRAVENOUS | Status: AC | PRN
  Administered 2012-04-11: 15 via INTRAVENOUS

## 2012-04-11 MED ORDER — IOHEXOL 300 MG/ML  SOLN
80.0000 mL | Freq: Once | INTRAMUSCULAR | Status: AC | PRN
  Administered 2012-04-11: 80 mL via INTRAVENOUS

## 2012-04-18 ENCOUNTER — Other Ambulatory Visit (HOSPITAL_BASED_OUTPATIENT_CLINIC_OR_DEPARTMENT_OTHER): Payer: 59 | Admitting: Lab

## 2012-04-18 ENCOUNTER — Ambulatory Visit: Payer: 59

## 2012-04-18 ENCOUNTER — Ambulatory Visit (HOSPITAL_BASED_OUTPATIENT_CLINIC_OR_DEPARTMENT_OTHER): Payer: 59 | Admitting: Oncology

## 2012-04-18 VITALS — BP 124/78 | HR 80 | Temp 99.1°F | Ht 66.5 in | Wt 172.9 lb

## 2012-04-18 DIAGNOSIS — C73 Malignant neoplasm of thyroid gland: Secondary | ICD-10-CM

## 2012-04-18 DIAGNOSIS — Z85850 Personal history of malignant neoplasm of thyroid: Secondary | ICD-10-CM

## 2012-04-18 DIAGNOSIS — Z85118 Personal history of other malignant neoplasm of bronchus and lung: Secondary | ICD-10-CM

## 2012-04-18 DIAGNOSIS — Z87898 Personal history of other specified conditions: Secondary | ICD-10-CM

## 2012-04-18 LAB — CBC WITH DIFFERENTIAL/PLATELET
Basophils Absolute: 0.1 10*3/uL (ref 0.0–0.1)
Eosinophils Absolute: 0.1 10*3/uL (ref 0.0–0.5)
HGB: 14.5 g/dL (ref 11.6–15.9)
MONO#: 0.7 10*3/uL (ref 0.1–0.9)
NEUT#: 5.8 10*3/uL (ref 1.5–6.5)
RBC: 4.99 10*6/uL (ref 3.70–5.45)
RDW: 12.8 % (ref 11.2–14.5)
WBC: 9.2 10*3/uL (ref 3.9–10.3)
lymph#: 2.5 10*3/uL (ref 0.9–3.3)
nRBC: 0 % (ref 0–0)

## 2012-04-18 NOTE — Progress Notes (Signed)
New Patient Hematology-Oncology Evaluation   Michelle Browning [id number] 25-Feb-1971 41 y.o. 04/18/2012  CC: Dr. Hassan Browning, [facility] [place]; Dr. 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 Fortunately things have not changed much since that time. 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 There was a smaller partially cystic 5 mm nodule in the same lobe. Needle biopsy suspicious for carcinoma. She underwent a total thyroidectomy with a limited cervical lymph node dissection 01/22/2002 by Dr. Darnell Browning. An anterior cervical lymph node, a right digastric lymph node and a lymph node sampled from the anterior compartment all contained metastatic papillary thyroid carcinoma with 4 of 4 nodes positive. She received her first dose of Iodine-131 in March 2003. Disease progressed in March of 2004 when she was found to have pulmonary metastases on a PET scan done at [facility] in [place]. She received a second dose of radioactive iodine in April 2004 and then a third dose in May of 2006 for persistent micrometastases in the lungs. She is on chronic thyroid suppression with Synthroid 200 mcg daily.   Her endocrinologist/oncologist at [facility] is no longer practicing there. Despite multiple attempts over the last few weeks, I have been unsuccessful in getting any data with respect to her previous treatment or any copies of radiographic studies. I initiated my own evaluation here in [place]. I am very pleased to report that a CT scan chest abdomen and pelvis and a PET scan done on 04/11/2012 showed no evidence of active disease.  She has had 2 children who are now 1 years old and 41 years old and both healthy. Her  menstrual cycles are still regular. She has had no interim medical problems except for chronic anxiety and depression controlled on 2 antidepressant medications.   PMH: No hypertension, diabetes, ulcers, hepatitis, kidney stones, seizures, prior blood clots, bleeding problems after surgery. No other surgeries other than the thyroid surgery   Allergies: No Known Allergies  Medications: Synthroid 200 mcg daily. Lexapro 20 mg daily. Wellbutrin XL 300 mg daily.   Social History:  she is a Child psychotherapist. Married with 2 children.  reports that she has quit smoking.  Social alcohol  Family History: Parents alive and well. She has one sister 2 years older than her who is healthy. A maternal aunt had breast cancer.  Review of Systems: Constitutional symptoms:no constitutional symptoms  HEENT: no sore throat  Respiratory:  No cough or dyspnea Cardiovascular:   no chest pain or palpitations  Gastrointestinal ROS:  no change in bowel habit  Genito-Urinary ROS:  Regular menses Hematological and Lymphatic: no swollen glands  Musculoskeletal:No muscle or bone pain Neurologic: no headache or change in vision  Dermatologic: no rash or ecchymosis  Remaining ROS negative.  Physical Exam: Blood pressure 124/78, pulse 80, temperature 99.1 F (37.3 C), temperature source Oral, height 5' 6.5" (1.689 m), weight 172 lb 14.4 oz (78.427 kg), last menstrual period 04/04/2012. Wt Readings from Last 3 Encounters:  04/18/12 172 lb 14.4 oz (78.427 kg)  02/07/12 174 lb (78.926 kg)    General appearance: well-nourished Caucasian woman Head:  normal Neck:  full range of motion , midline scar status post previous thyroidectomy, no palpable nodules  Lymph  nodes:No cervical supraclavicular or axillary adenopathy  Breasts: not examined  Lungs: clear to auscultation resonant to percussion  Heart: regular rhythm no murmur  Abdominal:Soft nontender no mass no organomegaly  GU: not examined  Extremities: no  edema no calf tenderness  Neurologic:Mental status intact, cranial nerves intact, pupils equal round reactive to light, optic disc sharp, motor strength 5 over 5, reflexes 3+ symmetric. Coordination and gait normal.  Skin: No rash or ecchymosis    Lab Results: Lab Results  Component Value Date   WBC 9.2 04/18/2012   HGB 14.5 04/18/2012   HCT 43.6 04/18/2012   MCV 87.4 04/18/2012   PLT 232 04/18/2012     Chemistry      Component Value Date/Time   NA 136 02/07/2012 1432   K 3.8 02/07/2012 1432   CL 101 02/07/2012 1432   CO2 24 02/07/2012 1432   BUN 9 02/07/2012 1432   CREATININE 0.85 02/07/2012 1432   CREATININE 0.84 06/02/2006 0000      Component Value Date/Time   CALCIUM 8.8 02/07/2012 1432   ALKPHOS 66 02/07/2012 1432   AST 17 02/07/2012 1432   ALT 17 02/07/2012 1432   BILITOT 0.8 02/07/2012 1432      TSH 0.008 done 02/07/2012. thyroglobulin pending.  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  Status post total thyroidectomy with limited lymph node dissection at diagnosis in February 2003. Status post postoperative radioactive iodine treatment. Status post 2 additional radioactive iodine treatments March 2004 and May 2006 for treatment of pulmonary metastases. She achieved a complete radiographic response which has been durable including CT scans done 04/16/2012  I'm going to refer her to a local endocrinologist for followup and adjustment of her thyroid medications. I reviewed with her the fact that we are making some progress in the treatment of thyroid cancers. 2 oral multi-kinase inhibitors have been FDA approved. The first one approved is Vandatenib. More recently I believe that a second drug was approved but I don't recall the name.  I plan to see her on an annual basis with a plain chest radiograph unless clinical exam or  laboratory suggest further progression.      Michelle Feinstein, MD 04/18/2012, 6:21 PM

## 2012-04-19 LAB — COMPREHENSIVE METABOLIC PANEL
ALT: 18 U/L (ref 0–35)
Albumin: 4.7 g/dL (ref 3.5–5.2)
CO2: 23 mEq/L (ref 19–32)
Calcium: 9 mg/dL (ref 8.4–10.5)
Chloride: 103 mEq/L (ref 96–112)
Glucose, Bld: 95 mg/dL (ref 70–99)
Potassium: 4.1 mEq/L (ref 3.5–5.3)
Sodium: 137 mEq/L (ref 135–145)
Total Bilirubin: 0.7 mg/dL (ref 0.3–1.2)
Total Protein: 7.7 g/dL (ref 6.0–8.3)

## 2012-04-19 LAB — LACTATE DEHYDROGENASE: LDH: 131 U/L (ref 94–250)

## 2012-04-20 ENCOUNTER — Telehealth: Payer: Self-pay

## 2012-04-20 NOTE — Telephone Encounter (Signed)
Message copied by Albertha Ghee on Fri Apr 20, 2012  4:35 PM ------      Message from: Levert Feinstein      Created: Fri Apr 20, 2012  7:01 AM       Call pt lab all good

## 2012-04-20 NOTE — Telephone Encounter (Signed)
Message left for pt with lab results per Dr Patsy Lager note.  dph

## 2012-04-24 LAB — THYROGLOBULIN ANTIBODY: Thyroglobulin Ab: <20 U/mL (ref <40.0)

## 2012-05-07 ENCOUNTER — Telehealth: Payer: Self-pay | Admitting: Oncology

## 2012-05-07 NOTE — Telephone Encounter (Signed)
Called Michelle Browning neurologic to check the appt for pt, i was informed that they never received chart, called [facility] Michelle Browning left message to fax record again, still waiting for appt date

## 2012-05-08 ENCOUNTER — Telehealth: Payer: Self-pay | Admitting: Oncology

## 2012-05-08 NOTE — Telephone Encounter (Signed)
Called Dr. Maryjo Rochester waitinf or appt m called pt, left message regarding appt with Dr. Sharl Ma.

## 2012-06-03 ENCOUNTER — Emergency Department
Admission: EM | Admit: 2012-06-03 | Discharge: 2012-06-03 | Disposition: A | Discharge status: Home / Self Care | Payer: 59 | Source: Home / Self Care

## 2012-06-03 DIAGNOSIS — J069 Acute upper respiratory infection, unspecified: Secondary | ICD-10-CM

## 2012-06-03 DIAGNOSIS — H9209 Otalgia, unspecified ear: Secondary | ICD-10-CM

## 2012-06-03 DIAGNOSIS — J029 Acute pharyngitis, unspecified: Secondary | ICD-10-CM

## 2012-06-03 DIAGNOSIS — J309 Allergic rhinitis, unspecified: Secondary | ICD-10-CM

## 2012-06-03 HISTORY — DX: Disorder of thyroid, unspecified: E07.9

## 2012-06-03 HISTORY — DX: Depression, unspecified: F32.A

## 2012-06-03 HISTORY — DX: Major depressive disorder, single episode, unspecified: F32.9

## 2012-06-03 MED ORDER — KETOROLAC TROMETHAMINE 60 MG/2ML IM SOLN
60.0000 mg | Freq: Once | INTRAMUSCULAR | Status: AC
  Administered 2012-06-03: 60 mg via INTRAMUSCULAR

## 2012-06-03 MED ORDER — CETIRIZINE HCL 10 MG PO CAPS: ORAL_CAPSULE | ORAL | Status: DC

## 2012-06-03 NOTE — ED Notes (Signed)
Sore throat for several days with BL ear pressure.

## 2012-06-03 NOTE — ED Provider Notes (Signed)
History     CSN: [id number]  Arrival date & time 06/03/12  1133   First MD Initiated Contact with Patient 06/03/12 1134      Chief Complaint  Patient presents with  . Sore Throat  . Otalgia   HPI Comments: URI Symptoms Onset: 1-2 weeks  Description: nasal congestion, rhinorrhea, post nasal drip, sore throat, cough, itchy eyes, scratchy throat  Modifying factors:  None; non smoker   Symptoms Nasal discharge: yes Fever: no Sore throat: yes Cough: yes; constant throat clearing  Wheezing: no Ear pain: ear fullness GI symptoms: diarrhea x 1 day Sick contacts: son  Red Flags  Stiff neck: no Dyspnea: no Rash: no Swallowing difficulty: no  Sinusitis Risk Factors Headache/face pain: no Double sickening: no tooth pain: no  Allergy Risk Factors Sneezing: yes Itchy scratchy throat: yes Seasonal symptoms: yes  Flu Risk Factors Headache: no muscle aches: no severe fatigue: no   Prior history of thyroid cancer that is in full remission per pt.   Past Medical History  Diagnosis Date  . Thyroid disease   . Depression     History reviewed. No pertinent past surgical history.  History reviewed. No pertinent family history.  History  Substance Use Topics  . Smoking status: Former Games developer  . Smokeless tobacco: Not on file  . Alcohol Use: Yes    OB History    Grav Para Term Preterm Abortions TAB SAB Ect Mult Living                  Review of Systems  All other systems reviewed and are negative.    Allergies  Review of patient's allergies indicates no known allergies.  Home Medications   Current Outpatient Rx  Name Route Sig Dispense Refill  . BUPROPION HCL ER (XL) 300 MG PO TB24 Oral Take 1 tablet (300 mg total) by mouth daily. 90 tablet 3  . ESCITALOPRAM OXALATE 20 MG PO TABS Oral Take 1 tablet (20 mg total) by mouth daily. 90 tablet 3  . LEVOTHYROXINE SODIUM 200 MCG PO TABS Oral Take 1 tablet (200 mcg total) by mouth daily. 90 tablet 3    BP  125/79  Pulse 93  Temp 98.1 F (36.7 C) (Oral)  Resp 20  Ht 5\' 6"  (1.676 m)  Wt 174 lb (78.926 kg)  BMI 28.08 kg/m2  SpO2 96%  LMP 05/13/2012  Physical Exam  Constitutional: She appears well-developed and well-nourished.  HENT:  Head: Normocephalic and atraumatic.  Right Ear: External ear normal.  Left Ear: External ear normal.  Mouth/Throat: No oropharyngeal exudate.       +nasal erythema, rhinorrhea bilaterally, + post oropharyngeal erythema    Eyes: Conjunctivae are normal. Pupils are equal, round, and reactive to light.  Neck: Normal range of motion. Neck supple.  Cardiovascular: Normal rate and regular rhythm.   Pulmonary/Chest: Effort normal and breath sounds normal.  Abdominal: Soft. Bowel sounds are normal.  Lymphadenopathy:    She has no cervical adenopathy.  Neurological: She is alert.  Skin: Skin is warm. No rash noted.    ED Course  Procedures (including critical care time)   Labs Reviewed  POCT RAPID STREP A (OFFICE)   No results found.   No diagnosis found.    MDM  Likely concominant URI and allergic rhinitis (untreated) Discussed supportive care and infectious red flags.  Zyrtec for allergic component.  Follow up as needed.  Handout given.    The patient and/or caregiver has been counseled thoroughly  with regard to treatment plan and/or medications prescribed including dosage, schedule, interactions, rationale for use, and possible side effects and they verbalize understanding. Diagnoses and expected course of recovery discussed and will return if not improved as expected or if the condition worsens. Patient and/or caregiver verbalized understanding.             Floydene Flock, MD 06/03/12 1203  Floydene Flock, MD 06/03/12 [id number]

## 2012-06-03 NOTE — Discharge Instructions (Signed)
Upper Respiratory Infection, Adult An upper respiratory infection (URI) is also known as the common cold. It is often caused by a type of germ (virus). Colds are easily spread (contagious). You can pass it to others by kissing, coughing, sneezing, or drinking out of the same glass. Usually, you get better in 1 or 2 weeks.  HOME CARE   Only take medicine as told by your doctor.   Use a warm mist humidifier or breathe in steam from a hot shower.   Drink enough water and fluids to keep your pee (urine) clear or pale yellow.   Get plenty of rest.   Return to work when your temperature is back to normal or as told by your doctor. You may use a face mask and wash your hands to stop your cold from spreading.  GET HELP RIGHT AWAY IF:   After the first few days, you feel you are getting worse.   You have questions about your medicine.   You have chills, shortness of breath, or brown or red spit (mucus).   You have yellow or brown snot (nasal discharge) or pain in the face, especially when you bend forward.   You have a fever, puffy (swollen) neck, pain when you swallow, or white spots in the back of your throat.   You have a bad headache, ear pain, sinus pain, or chest pain.   You have a high-pitched whistling sound when you breathe in and out (wheezing).   You have a lasting cough or cough up blood.   You have sore muscles or a stiff neck.  MAKE SURE YOU:   Understand these instructions.   Will watch your condition.   Will get help right away if you are not doing well or get worse.  Document Released: 05/09/2008 Document Revised: 11/10/2011 Document Reviewed: 03/28/2011 [facility] Patient Information 2012 [place], [place].  Allergic Rhinitis Allergic rhinitis is when the mucous membranes in the nose respond to allergens. Allergens are particles in the air that cause your body to have an allergic reaction. This causes you to release allergic antibodies. Through a chain of events, these  eventually cause you to release histamine into the blood stream (hence the use of antihistamines). Although meant to be protective to the body, it is this release that causes your discomfort, such as frequent sneezing, congestion and an itchy runny nose.  CAUSES  The pollen allergens may come from grasses, trees, and weeds. This is seasonal allergic rhinitis, or "hay fever." Other allergens cause year-round allergic rhinitis (perennial allergic rhinitis) such as house dust mite allergen, pet dander and mold spores.  SYMPTOMS   Nasal stuffiness (congestion).   Runny, itchy nose with sneezing and tearing of the eyes.   There is often an itching of the mouth, eyes and ears.  It cannot be cured, but it can be controlled with medications. DIAGNOSIS  If you are unable to determine the offending allergen, skin or blood testing may find it. TREATMENT   Avoid the allergen.   Medications and allergy shots (immunotherapy) can help.   Hay fever may often be treated with antihistamines in pill or nasal spray forms. Antihistamines block the effects of histamine. There are over-the-counter medicines that may help with nasal congestion and swelling around the eyes. Check with your caregiver before taking or giving this medicine.  If the treatment above does not work, there are many new medications your caregiver can prescribe. Stronger medications may be used if initial measures are ineffective. Desensitizing  injections can be used if medications and avoidance fails. Desensitization is when a patient is given ongoing shots until the body becomes less sensitive to the allergen. Make sure you follow up with your caregiver if problems continue. SEEK MEDICAL CARE IF:   You develop fever (more than 100.5 F (38.1 C).   You develop a cough that does not stop easily (persistent).   You have shortness of breath.   You start wheezing.   Symptoms interfere with normal daily activities.  Document Released:  08/16/2001 Document Revised: 11/10/2011 Document Reviewed: 02/25/2009 [facility] Patient Information 2012 [place], [place].

## 2012-06-03 NOTE — ED Provider Notes (Signed)
Patient was seen by Dr. Newton  Michelle Merida H Devanny Palecek, MD 06/03/12 1758 

## 2012-12-12 ENCOUNTER — Telehealth: Payer: Self-pay | Admitting: Physician Assistant

## 2012-12-12 DIAGNOSIS — Z1239 Encounter for other screening for malignant neoplasm of breast: Secondary | ICD-10-CM

## 2012-12-12 NOTE — Telephone Encounter (Signed)
Yes I can do only pap. If need CPE makes since to do together because ins will pay for both together. Will send mammogram referral in.

## 2012-12-12 NOTE — Telephone Encounter (Signed)
Patient request to know if she can just schedule a pap only with you or does she need to have a cpe in order to have pap done, and if she can have a mammogram. Patient request a call back about inquires. Thanks

## 2013-01-01 ENCOUNTER — Other Ambulatory Visit: Payer: Self-pay | Admitting: *Deleted

## 2013-01-01 DIAGNOSIS — C73 Malignant neoplasm of thyroid gland: Secondary | ICD-10-CM

## 2013-01-01 MED ORDER — LEVOTHYROXINE SODIUM 200 MCG PO TABS: 200.0000 ug | ORAL_TABLET | Freq: Every day | ORAL | Status: DC

## 2013-01-13 ENCOUNTER — Emergency Department (HOSPITAL_BASED_OUTPATIENT_CLINIC_OR_DEPARTMENT_OTHER)
Admission: EM | Admit: 2013-01-13 | Discharge: 2013-01-13 | Disposition: A | Discharge status: Home / Self Care | Payer: 59 | Attending: Emergency Medicine | Admitting: Emergency Medicine

## 2013-01-13 ENCOUNTER — Encounter (HOSPITAL_BASED_OUTPATIENT_CLINIC_OR_DEPARTMENT_OTHER): Payer: Self-pay | Admitting: *Deleted

## 2013-01-13 ENCOUNTER — Emergency Department (HOSPITAL_BASED_OUTPATIENT_CLINIC_OR_DEPARTMENT_OTHER): Payer: 59

## 2013-01-13 DIAGNOSIS — S7000XA Contusion of unspecified hip, initial encounter: Secondary | ICD-10-CM | POA: Insufficient documentation

## 2013-01-13 DIAGNOSIS — S20211A Contusion of right front wall of thorax, initial encounter: Secondary | ICD-10-CM

## 2013-01-13 DIAGNOSIS — S7001XA Contusion of right hip, initial encounter: Secondary | ICD-10-CM

## 2013-01-13 DIAGNOSIS — S40029A Contusion of unspecified upper arm, initial encounter: Secondary | ICD-10-CM | POA: Insufficient documentation

## 2013-01-13 DIAGNOSIS — F3289 Other specified depressive episodes: Secondary | ICD-10-CM | POA: Insufficient documentation

## 2013-01-13 DIAGNOSIS — Z87891 Personal history of nicotine dependence: Secondary | ICD-10-CM | POA: Insufficient documentation

## 2013-01-13 DIAGNOSIS — Y929 Unspecified place or not applicable: Secondary | ICD-10-CM | POA: Insufficient documentation

## 2013-01-13 DIAGNOSIS — E039 Hypothyroidism, unspecified: Secondary | ICD-10-CM | POA: Insufficient documentation

## 2013-01-13 DIAGNOSIS — S20219A Contusion of unspecified front wall of thorax, initial encounter: Secondary | ICD-10-CM | POA: Insufficient documentation

## 2013-01-13 DIAGNOSIS — Y9301 Activity, walking, marching and hiking: Secondary | ICD-10-CM | POA: Insufficient documentation

## 2013-01-13 DIAGNOSIS — W1789XA Other fall from one level to another, initial encounter: Secondary | ICD-10-CM | POA: Insufficient documentation

## 2013-01-13 DIAGNOSIS — F329 Major depressive disorder, single episode, unspecified: Secondary | ICD-10-CM | POA: Insufficient documentation

## 2013-01-13 MED ORDER — OXYCODONE-ACETAMINOPHEN 5-325 MG PO TABS
2.0000 | ORAL_TABLET | Freq: Once | ORAL | Status: AC
  Administered 2013-01-13: 2 via ORAL
  Filled 2013-01-13 (×2): qty 2

## 2013-01-13 MED ORDER — HYDROCODONE-ACETAMINOPHEN 5-325 MG PO TABS: 2.0000 | ORAL_TABLET | ORAL | Status: DC | PRN

## 2013-01-13 MED ORDER — HYDROCODONE-ACETAMINOPHEN 5-325 MG PO TABS
2.0000 | ORAL_TABLET | ORAL | Status: DC | PRN
Start: 2013-01-13 — End: 2013-01-13

## 2013-01-13 NOTE — ED Notes (Signed)
Pt states she fell down 13 steps this a.m. Now c/o right rib pain. Felt something pop. C-collar applied at triage. Placed in wheelchair.

## 2013-01-13 NOTE — ED Provider Notes (Signed)
Medical screening examination/treatment/procedure(s) were performed by non-physician practitioner and as supervising physician I was immediately available for consultation/collaboration.   David H Yao, MD 01/13/13 2333 

## 2013-01-13 NOTE — ED Provider Notes (Signed)
History     CSN: [id number]  Arrival date & time 01/13/13  1644   First MD Initiated Contact with Patient 01/13/13 1716      Chief Complaint  Patient presents with  . Fall    (Consider location/radiation/quality/duration/timing/severity/associated sxs/prior treatment) Patient is a 42 y.o. female presenting with fall. The history is provided by the patient. No language interpreter was used.  Fall The accident occurred 6 to 12 hours ago. The fall occurred while walking. She fell from a height of 3 to 5 ft. She landed on carpet. There was no blood loss. The pain is present in the right hip. The pain is at a severity of 6/10. The pain is moderate. She was not ambulatory at the scene. There was no entrapment after the fall. There was no drug use involved in the accident. Pertinent negatives include no headaches. The symptoms are aggravated by activity. She has tried nothing for the symptoms.  Pt complains of bruising to right arm and right ribs.    Past Medical History  Diagnosis Date  . Thyroid disease   . Depression     History reviewed. No pertinent past surgical history.  History reviewed. No pertinent family history.  History  Substance Use Topics  . Smoking status: Former Games developer  . Smokeless tobacco: Not on file  . Alcohol Use: Yes    OB History   Grav Para Term Preterm Abortions TAB SAB Ect Mult Living                  Review of Systems  Skin: Positive for color change.  Neurological: Negative for headaches.  All other systems reviewed and are negative.    Allergies  Review of patient's allergies indicates no known allergies.  Home Medications   Current Outpatient Rx  Name  Route  Sig  Dispense  Refill  . buPROPion (WELLBUTRIN XL) 300 MG 24 hr tablet   Oral   Take 1 tablet (300 mg total) by mouth daily.   90 tablet   3   . Cetirizine HCl (ZYRTEC ALLERGY) 10 MG CAPS      1 tab daily   30 capsule   3   . escitalopram (LEXAPRO) 20 MG tablet   Oral   Take 1 tablet (20 mg total) by mouth daily.   90 tablet   3   . levothyroxine (SYNTHROID, LEVOTHROID) 200 MCG tablet   Oral   Take 1 tablet (200 mcg total) by mouth daily.   20 tablet   0     Must make appointment     BP 144/88  Pulse 102  Temp(Src) 98.4 F (36.9 C) (Oral)  Resp 20  Ht 5\' 7"  (1.702 m)  Wt 160 lb (72.576 kg)  BMI 25.05 kg/m2  SpO2 99%  LMP 01/13/2013  Physical Exam  Nursing note and vitals reviewed. Constitutional: She is oriented to person, place, and time. She appears well-developed and well-nourished.  HENT:  Head: Normocephalic.  Mouth/Throat: Oropharynx is clear and moist.  Eyes: Pupils are equal, round, and reactive to light.  Neck: Normal range of motion.  Cardiovascular: Normal rate.   Pulmonary/Chest: Effort normal. She exhibits tenderness.  Tender right lower ribs  Abdominal: Soft.  Musculoskeletal: She exhibits tenderness.  Bruised right hip   Neurological: She is alert and oriented to person, place, and time. She has normal reflexes.  Skin: Skin is warm.  Psychiatric: She has a normal mood and affect.    ED Course  Procedures (  including critical care time)  Labs Reviewed - No data to display Dg Ribs Unilateral W/chest Right  01/13/2013  *RADIOLOGY REPORT*  Clinical Data: Larey Seat down steps, right-sided rib pain  RIGHT RIBS AND CHEST - 3+ VIEW  Comparison: Chest x-ray of 04/21/2006  Findings: No active infiltrate or effusion is seen.  Mediastinal contours appear normal.  The heart is within normal limits in size.  Right rib detail and show no acute right rib fracture.  IMPRESSION: No active lung disease.  Native right rib detail.   Original Report Authenticated By: Dwyane Dee, M.D.    Dg Hip Complete Right  01/13/2013  *RADIOLOGY REPORT*  Clinical Data: Larey Seat down stairs, right hip pain  RIGHT HIP - COMPLETE 2+ VIEW  Comparison: None.  Findings: Both hip joints appear normal for age.  The pelvic rami are intact.  The SI joints appear  corticated.  IMPRESSION: Negative.   Original Report Authenticated By: Dwyane Dee, M.D.      1. Contusion of right hip   2. Contusion, chest wall, right, initial encounter       MDM  Pt reports pain is improved with medication,  No fracture on xrays        Lonia Skinner [place], [place] 01/13/13 2108  Lonia Skinner [place], [place] 01/13/13 2109

## 2013-02-19 ENCOUNTER — Other Ambulatory Visit: Payer: Self-pay | Admitting: *Deleted

## 2013-02-19 DIAGNOSIS — C73 Malignant neoplasm of thyroid gland: Secondary | ICD-10-CM

## 2013-02-19 MED ORDER — LEVOTHYROXINE SODIUM 200 MCG PO TABS: 200.0000 ug | ORAL_TABLET | Freq: Every day | ORAL | Status: DC

## 2013-02-19 NOTE — Telephone Encounter (Signed)
Med refill

## 2013-02-20 ENCOUNTER — Other Ambulatory Visit (HOSPITAL_COMMUNITY)
Admission: RE | Admit: 2013-02-20 | Discharge: 2013-02-20 | Disposition: A | Discharge status: Home / Self Care | Payer: 59 | Source: Ambulatory Visit | Attending: Family Medicine | Admitting: Family Medicine

## 2013-02-20 ENCOUNTER — Encounter: Payer: Self-pay | Admitting: Physician Assistant

## 2013-02-20 ENCOUNTER — Ambulatory Visit (INDEPENDENT_AMBULATORY_CARE_PROVIDER_SITE_OTHER): Payer: 59 | Admitting: Physician Assistant

## 2013-02-20 VITALS — BP 132/79 | HR 80 | Wt 171.0 lb

## 2013-02-20 DIAGNOSIS — Z8379 Family history of other diseases of the digestive system: Secondary | ICD-10-CM

## 2013-02-20 DIAGNOSIS — Z131 Encounter for screening for diabetes mellitus: Secondary | ICD-10-CM

## 2013-02-20 DIAGNOSIS — Z01419 Encounter for gynecological examination (general) (routine) without abnormal findings: Secondary | ICD-10-CM | POA: Insufficient documentation

## 2013-02-20 DIAGNOSIS — Z124 Encounter for screening for malignant neoplasm of cervix: Secondary | ICD-10-CM

## 2013-02-20 DIAGNOSIS — R110 Nausea: Secondary | ICD-10-CM

## 2013-02-20 DIAGNOSIS — Z13220 Encounter for screening for lipoid disorders: Secondary | ICD-10-CM

## 2013-02-20 DIAGNOSIS — E039 Hypothyroidism, unspecified: Secondary | ICD-10-CM

## 2013-02-20 DIAGNOSIS — Z0000 Encounter for general adult medical examination without abnormal findings: Secondary | ICD-10-CM

## 2013-02-20 DIAGNOSIS — Z1151 Encounter for screening for human papillomavirus (HPV): Secondary | ICD-10-CM | POA: Insufficient documentation

## 2013-02-20 DIAGNOSIS — Z1239 Encounter for other screening for malignant neoplasm of breast: Secondary | ICD-10-CM

## 2013-02-20 DIAGNOSIS — R109 Unspecified abdominal pain: Secondary | ICD-10-CM

## 2013-02-20 DIAGNOSIS — R5381 Other malaise: Secondary | ICD-10-CM

## 2013-02-20 LAB — CBC
MCH: 28.5 pg (ref 26.0–34.0)
MCHC: 33.9 g/dL (ref 30.0–36.0)
MCV: 84 fL (ref 78.0–100.0)
Platelets: 209 10*3/uL (ref 150–400)
RBC: 5.06 MIL/uL (ref 3.87–5.11)

## 2013-02-20 LAB — COMPREHENSIVE METABOLIC PANEL
ALT: 18 U/L (ref 0–35)
AST: 18 U/L (ref 0–37)
CO2: 26 mEq/L (ref 19–32)
Calcium: 9.2 mg/dL (ref 8.4–10.5)
Chloride: 103 mEq/L (ref 96–112)
Potassium: 4.4 mEq/L (ref 3.5–5.3)
Sodium: 137 mEq/L (ref 135–145)
Total Protein: 7.5 g/dL (ref 6.0–8.3)

## 2013-02-20 LAB — LIPID PANEL
LDL Cholesterol: 145 mg/dL — ABNORMAL HIGH (ref 0–99)
Triglycerides: 111 mg/dL (ref <150)
VLDL: 22 mg/dL (ref 0–40)

## 2013-02-20 LAB — T4, FREE: Free T4: 1.7 ng/dL (ref 0.80–1.80)

## 2013-02-20 NOTE — Patient Instructions (Addendum)
Will refer for mammogram.  Start MVI and Calcium 500mg  twice a day.

## 2013-02-21 LAB — GLIADIN ANTIBODIES, SERUM
Gliadin IgA: 4.7 U/mL (ref <20)
Gliadin IgG: 5.5 U/mL (ref <20)

## 2013-02-21 LAB — RETICULIN ANTIBODIES, IGA W TITER: Reticulin Ab, IgA: NEGATIVE

## 2013-02-21 LAB — VITAMIN D 25 HYDROXY (VIT D DEFICIENCY, FRACTURES): Vit D, 25-Hydroxy: 30 ng/mL (ref 30–89)

## 2013-02-21 LAB — TISSUE TRANSGLUTAMINASE, IGA: Tissue Transglutaminase Ab, IgA: 4.8 U/mL (ref <20)

## 2013-02-22 ENCOUNTER — Other Ambulatory Visit: Payer: Self-pay | Admitting: Physician Assistant

## 2013-02-22 MED ORDER — LEVOTHYROXINE SODIUM 175 MCG PO TABS: 175.0000 ug | ORAL_TABLET | Freq: Every day | ORAL | Status: DC

## 2013-02-22 NOTE — Progress Notes (Signed)
  Subjective:     Michelle Browning is a 42 y.o. female and is here for a comprehensive physical exam. The patient reports problems - pt has had some ongoing fatigue. she has not had TSH levels checked in a while. she does not see endocrinology since thyroid cancer. Would like rechecked. her daughter was diagnosed with celiac disease and would like to be tested. She is currently on a gluten free diet and seems to help her ongoing abdominal cramping and nausea feeling after eating. Marland Kitchen  History   Social History  . Marital Status: Married    Spouse Name: N/A    Number of Children: N/A  . Years of Education: N/A   Occupational History  . Not on file.   Social History Main Topics  . Smoking status: Former Games developer  . Smokeless tobacco: Not on file  . Alcohol Use: Yes  . Drug Use: No  . Sexually Active: Not on file   Other Topics Concern  . Not on file   Social History Narrative  . No narrative on file   Health Maintenance  Topic Date Due  . Influenza Vaccine  08/05/2012  . Pap Smear  02/21/2016  . Tetanus/tdap  02/06/2022    The following portions of the patient's history were reviewed and updated as appropriate: allergies, current medications, past family history, past medical history, past social history, past surgical history and problem list.  Review of Systems A comprehensive review of systems was negative.   Objective:    BP 132/79  Pulse 80  Wt 171 lb (77.565 kg)  BMI 26.78 kg/m2 General appearance: alert, cooperative and appears stated age Head: Normocephalic, without obvious abnormality, atraumatic Eyes: conjunctivae/corneas clear. PERRL, EOM's intact. Fundi benign. Ears: normal TM's and external ear canals both ears Nose: Nares normal. Septum midline. Mucosa normal. No drainage or sinus tenderness. Throat: lips, mucosa, and tongue normal; teeth and gums normal Neck: no adenopathy, no carotid bruit, no JVD, supple, symmetrical, trachea midline and thyroid not  enlarged, symmetric, no tenderness/mass/nodules Back: symmetric, no curvature. ROM normal. No CVA tenderness. Lungs: clear to auscultation bilaterally Heart: regular rate and rhythm, S1, S2 normal, no murmur, click, rub or gallop Abdomen: soft, non-tender; bowel sounds normal; no masses,  no organomegaly Pelvic: cervix normal in appearance, external genitalia normal, no adnexal masses or tenderness, no cervical motion tenderness, uterus normal size, shape, and consistency and vagina normal without discharge Extremities: extremities normal, atraumatic, no cyanosis or edema Pulses: 2+ and symmetric Skin: Skin color, texture, turgor normal. No rashes or lesions Lymph nodes: Cervical, supraclavicular, and axillary nodes normal. Neurologic: Grossly normal    Assessment:    Healthy female exam.      Plan:  CPE- Pap done today. Will check screening labs.Vaccines up to date. Will refer for mammogram. Encouraged regular exercise and balanced diet. Discussed importance of Calcium 500mg  BID.  Hypothyroidism- Will check TSH.   Nausea/abdominal cramping/family hx of celiac disease- Will screen for celiac disease.    See After Visit Summary for Counseling Recommendations

## 2013-03-15 ENCOUNTER — Other Ambulatory Visit: Payer: Self-pay | Admitting: *Deleted

## 2013-03-15 DIAGNOSIS — F329 Major depressive disorder, single episode, unspecified: Secondary | ICD-10-CM

## 2013-03-15 MED ORDER — BUPROPION HCL ER (XL) 300 MG PO TB24: 300.0000 mg | ORAL_TABLET | Freq: Every day | ORAL | Status: DC

## 2013-03-15 MED ORDER — LEVOTHYROXINE SODIUM 175 MCG PO TABS: 175.0000 ug | ORAL_TABLET | Freq: Every day | ORAL | Status: DC

## 2013-03-15 MED ORDER — ESCITALOPRAM OXALATE 20 MG PO TABS: 20.0000 mg | ORAL_TABLET | Freq: Every day | ORAL | Status: DC

## 2013-04-23 ENCOUNTER — Ambulatory Visit: Payer: 59 | Admitting: Oncology

## 2013-04-25 ENCOUNTER — Encounter: Payer: Self-pay | Admitting: Oncology

## 2013-04-25 NOTE — Progress Notes (Unsigned)
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 Most recent restaging evaluation done 04/11/2012 with a PET scan showed no abnormal activity on that study. She failed to report for her visit today. We will need to reschedule her.

## 2013-06-04 ENCOUNTER — Other Ambulatory Visit: Payer: Self-pay | Admitting: Physician Assistant

## 2013-06-26 ENCOUNTER — Ambulatory Visit (HOSPITAL_BASED_OUTPATIENT_CLINIC_OR_DEPARTMENT_OTHER): Payer: 59

## 2013-07-09 ENCOUNTER — Telehealth: Payer: Self-pay | Admitting: *Deleted

## 2013-07-09 NOTE — Telephone Encounter (Signed)
Pt called today & left a vm stating that she has found a dime size enlarged lymph node under her armpit & is asking if we will send an order for a dx mammo to the breast center.  Would you like to see her first? Please advise

## 2013-07-10 NOTE — Telephone Encounter (Signed)
Left detailed message on vm.

## 2013-07-10 NOTE — Telephone Encounter (Signed)
Schedule pt to come in today. I do need to document location to send referal as well as we might consider a CBC/blood work.

## 2013-08-09 ENCOUNTER — Other Ambulatory Visit: Payer: Self-pay | Admitting: Physician Assistant

## 2013-08-12 NOTE — Telephone Encounter (Signed)
Needs appt. Change synthroid at last visit and need to recheck.

## 2013-09-14 ENCOUNTER — Other Ambulatory Visit: Payer: Self-pay | Admitting: Physician Assistant

## 2013-09-20 ENCOUNTER — Other Ambulatory Visit: Payer: Self-pay | Admitting: *Deleted

## 2013-09-20 ENCOUNTER — Telehealth: Payer: Self-pay | Admitting: *Deleted

## 2013-09-20 MED ORDER — SYNTHROID 175 MCG PO TABS: ORAL_TABLET | ORAL | Status: DC

## 2013-09-20 NOTE — Telephone Encounter (Signed)
LMOM informing pt of refill & to schedule appt.

## 2013-09-20 NOTE — Telephone Encounter (Signed)
Pt left message stating her levothyroxine was denied & she has been without it for a week.  She looks like she needs a recheck on labs but I was wondering if you needed to see her again as well?

## 2013-09-20 NOTE — Telephone Encounter (Signed)
Yes refill for a month and schedule appt to check levels.

## 2013-10-25 ENCOUNTER — Ambulatory Visit (INDEPENDENT_AMBULATORY_CARE_PROVIDER_SITE_OTHER): Payer: 59 | Admitting: Physician Assistant

## 2013-10-25 ENCOUNTER — Encounter: Payer: Self-pay | Admitting: Physician Assistant

## 2013-10-25 VITALS — BP 129/77 | HR 89 | Wt 182.0 lb

## 2013-10-25 DIAGNOSIS — R5383 Other fatigue: Secondary | ICD-10-CM

## 2013-10-25 DIAGNOSIS — R5381 Other malaise: Secondary | ICD-10-CM

## 2013-10-25 DIAGNOSIS — F329 Major depressive disorder, single episode, unspecified: Secondary | ICD-10-CM

## 2013-10-25 DIAGNOSIS — F341 Dysthymic disorder: Secondary | ICD-10-CM

## 2013-10-25 DIAGNOSIS — F419 Anxiety disorder, unspecified: Secondary | ICD-10-CM

## 2013-10-25 DIAGNOSIS — Z72820 Sleep deprivation: Secondary | ICD-10-CM

## 2013-10-25 DIAGNOSIS — C73 Malignant neoplasm of thyroid gland: Secondary | ICD-10-CM

## 2013-10-25 DIAGNOSIS — E039 Hypothyroidism, unspecified: Secondary | ICD-10-CM

## 2013-10-25 MED ORDER — BUPROPION HCL ER (XL) 300 MG PO TB24: 300.0000 mg | ORAL_TABLET | Freq: Every day | ORAL | Status: DC

## 2013-10-25 MED ORDER — ESCITALOPRAM OXALATE 20 MG PO TABS: 20.0000 mg | ORAL_TABLET | Freq: Every day | ORAL | Status: DC

## 2013-10-25 NOTE — Progress Notes (Signed)
  Subjective:    Patient ID: Michelle Browning, female    DOB: 10/08/1971, 42 y.o.   MRN: [id number]  HPI Patient presents to the clinic today to follow up and get meds refilled.   Anxiety/depression- well controlled today. Feels good on current dose. Takes medication regularly.  Pt does report more fatigue today. She admits to not sleeping well. Some days she only sleeps 4-5 hours. She admits to snoring. Not tried anything to make better. Not as much problems going to sleep as staying asleep. She denies any exercise. She admits to eating unhealthy. She does have hx of thyroid cancer and hypothyroidism.     Review of Systems     Objective:   Physical Exam  Constitutional: She is oriented to person, place, and time. She appears well-developed and well-nourished.  HENT:  Head: Normocephalic and atraumatic.  Eyes: Conjunctivae are normal.  Neck: Normal range of motion. Neck supple. No thyromegaly present.  Cardiovascular: Normal rate, regular rhythm and normal heart sounds.   Pulmonary/Chest: Effort normal and breath sounds normal.  Neurological: She is alert and oriented to person, place, and time.  Skin: Skin is warm and dry.  Psychiatric: She has a normal mood and affect. Her behavior is normal.          Assessment & Plan:  Anxiety/Depression- Well controlled. Refilled wellbutrin and lexapro for 6 months.   Hypothyroidism/thyroid cancer- Pt gets yearly f/u with oncologist. Will check TSH/Free T4 today. Will refill accordingly.   Fatigue/problems sleeping- unclear etiology could be thyroid vs sleeping problems vs anemia. Check other labs today Vit D, B12, CBC. I do think not sleeping could be causing fatigue. Recommend trying melatonin and/or valerian root. Follow up in 2 months or so. Encouraged pt to keep work on good bedtime routine. Will call with labs.

## 2013-10-25 NOTE — Patient Instructions (Signed)
Valarian Root 300mg  at bedtime.  Melatonin 3-10mg  at bedtime.

## 2013-10-26 LAB — CBC WITH DIFFERENTIAL/PLATELET
Basophils Relative: 1 % (ref 0–1)
Eosinophils Absolute: 0.2 10*3/uL (ref 0.0–0.7)
Eosinophils Relative: 3 % (ref 0–5)
Hemoglobin: 13.3 g/dL (ref 12.0–15.0)
Lymphs Abs: 2.8 10*3/uL (ref 0.7–4.0)
MCH: 28.1 pg (ref 26.0–34.0)
MCHC: 34.1 g/dL (ref 30.0–36.0)
MCV: 82.3 fL (ref 78.0–100.0)
Monocytes Absolute: 0.8 10*3/uL (ref 0.1–1.0)
Monocytes Relative: 9 % (ref 3–12)
Neutrophils Relative %: 54 % (ref 43–77)
Platelets: 233 10*3/uL (ref 150–400)

## 2013-10-26 LAB — T4, FREE: Free T4: 1.45 ng/dL (ref 0.80–1.80)

## 2013-10-26 LAB — FERRITIN: Ferritin: 14 ng/mL (ref 10–291)

## 2013-10-26 LAB — VITAMIN B12: Vitamin B-12: 1149 pg/mL — ABNORMAL HIGH (ref 211–911)

## 2013-10-27 ENCOUNTER — Other Ambulatory Visit: Payer: Self-pay | Admitting: Physician Assistant

## 2013-11-30 ENCOUNTER — Other Ambulatory Visit: Payer: Self-pay | Admitting: Physician Assistant

## 2014-01-03 ENCOUNTER — Other Ambulatory Visit: Payer: Self-pay | Admitting: Physician Assistant

## 2014-02-01 ENCOUNTER — Encounter: Payer: Self-pay | Admitting: Oncology

## 2014-02-09 ENCOUNTER — Other Ambulatory Visit: Payer: Self-pay | Admitting: Physician Assistant

## 2014-03-14 ENCOUNTER — Other Ambulatory Visit: Payer: Self-pay | Admitting: Physician Assistant

## 2014-03-15 ENCOUNTER — Other Ambulatory Visit: Payer: Self-pay | Admitting: Physician Assistant

## 2014-04-21 ENCOUNTER — Other Ambulatory Visit: Payer: Self-pay | Admitting: Physician Assistant

## 2014-05-15 ENCOUNTER — Other Ambulatory Visit: Payer: Self-pay

## 2014-05-15 ENCOUNTER — Ambulatory Visit: Payer: 59 | Admitting: Oncology

## 2014-05-15 MED ORDER — SYNTHROID 175 MCG PO TABS: 175.0000 ug | ORAL_TABLET | Freq: Every day | ORAL | Status: DC

## 2014-05-15 NOTE — Telephone Encounter (Signed)
Last refill if she does not keep appointment.

## 2014-05-16 ENCOUNTER — Telehealth: Payer: Self-pay | Admitting: *Deleted

## 2014-05-16 NOTE — Telephone Encounter (Signed)
Left VM on home and cell # for patient to call office to reschedule the appointment that was missed on 05/15/14 with Dr. Benay Spice.

## 2014-05-26 ENCOUNTER — Encounter: Payer: Self-pay | Admitting: Physician Assistant

## 2014-05-26 ENCOUNTER — Ambulatory Visit (INDEPENDENT_AMBULATORY_CARE_PROVIDER_SITE_OTHER): Payer: 59 | Admitting: Physician Assistant

## 2014-05-26 VITALS — BP 118/78 | HR 108 | Ht 67.0 in | Wt 171.0 lb

## 2014-05-26 DIAGNOSIS — F341 Dysthymic disorder: Secondary | ICD-10-CM

## 2014-05-26 DIAGNOSIS — F329 Major depressive disorder, single episode, unspecified: Secondary | ICD-10-CM

## 2014-05-26 DIAGNOSIS — F419 Anxiety disorder, unspecified: Principal | ICD-10-CM

## 2014-05-26 DIAGNOSIS — E039 Hypothyroidism, unspecified: Secondary | ICD-10-CM

## 2014-05-26 DIAGNOSIS — Z131 Encounter for screening for diabetes mellitus: Secondary | ICD-10-CM

## 2014-05-26 DIAGNOSIS — E785 Hyperlipidemia, unspecified: Secondary | ICD-10-CM

## 2014-05-26 DIAGNOSIS — R5381 Other malaise: Secondary | ICD-10-CM

## 2014-05-26 DIAGNOSIS — R5382 Chronic fatigue, unspecified: Secondary | ICD-10-CM

## 2014-05-26 DIAGNOSIS — R5383 Other fatigue: Secondary | ICD-10-CM

## 2014-05-26 MED ORDER — ESCITALOPRAM OXALATE 20 MG PO TABS: ORAL_TABLET | ORAL | Status: DC

## 2014-05-26 MED ORDER — SYNTHROID 175 MCG PO TABS: 175.0000 ug | ORAL_TABLET | Freq: Every day | ORAL | Status: DC

## 2014-05-26 MED ORDER — BUPROPION HCL ER (XL) 300 MG PO TB24: ORAL_TABLET | ORAL | Status: DC

## 2014-05-26 NOTE — Progress Notes (Addendum)
   Subjective:    Patient ID: Michelle Browning, female    DOB: Oct 24, 1971, 43 y.o.   MRN: [id number]  HPI Pt is a 43 yo female who presents to the clinic for 6 month follow up.   Anxiety and depression- controlled on wellbutrin and lexapro. Denies any suicidal or homicidal thoughts. Doing well. Denies any exercise and not eating like she should.    Hypothyroidism- feels fine except for fatigued. Denies any sleeping problems. Does wake up through the night to urinate. She does admit that the least fatigue she has felt is when she was not eating gluten and on a detox diet.     Review of Systems  All other systems reviewed and are negative.      Objective:   Physical Exam  Constitutional: She is oriented to person, place, and time. She appears well-developed and well-nourished.  HENT:  Head: Normocephalic and atraumatic.  Cardiovascular: Regular rhythm and normal heart sounds.   Tachycardia at 108.  Pulmonary/Chest: Effort normal and breath sounds normal. She has no wheezes.  Neurological: She is alert and oriented to person, place, and time.  Skin: Skin is dry.  Psychiatric: She has a normal mood and affect. Her behavior is normal.          Assessment & Plan:   Anxiety and depression- GAD-7 was 1. PHQ-9 was3. wellbutrin and lexapro refilled for 6 months.   Fatigue, chronic- labs have all checked out previously. Will check TSH and b12 today. Certainly sounds like diet is a contributing factor as well as weight. Discussed weight loss. Discussed phentermine. HR is a little elevated so will not start today. Encouraged 1200-1500 calorie diet with exercise at least 4 times a week. Follow up in 2 months.   Hyperlipidemia- will get labs to recheck. At this point no meds have been needed.   Needs fasting labs drawn. Lab slip given.

## 2014-06-05 ENCOUNTER — Other Ambulatory Visit: Payer: Self-pay | Admitting: Physician Assistant

## 2014-06-06 LAB — COMPLETE METABOLIC PANEL WITH GFR
ALT: 19 U/L (ref 0–35)
AST: 18 U/L (ref 0–37)
Albumin: 3.8 g/dL (ref 3.5–5.2)
Alkaline Phosphatase: 55 U/L (ref 39–117)
BUN: 11 mg/dL (ref 6–23)
CO2: 24 meq/L (ref 19–32)
Calcium: 7.9 mg/dL — ABNORMAL LOW (ref 8.4–10.5)
Chloride: 105 mEq/L (ref 96–112)
Creat: 0.79 mg/dL (ref 0.50–1.10)
GFR, Est African American: >89 mL/min
GFR, Est Non African American: >89 mL/min
Glucose, Bld: 90 mg/dL (ref 70–99)
POTASSIUM: 3.7 meq/L (ref 3.5–5.3)
Sodium: 136 mEq/L (ref 135–145)
TOTAL PROTEIN: 6.5 g/dL (ref 6.0–8.3)
Total Bilirubin: 0.7 mg/dL (ref 0.2–1.2)

## 2014-06-06 LAB — LIPID PANEL
Cholesterol: 212 mg/dL — ABNORMAL HIGH (ref 0–200)
HDL: 71 mg/dL (ref >39)
LDL Cholesterol: 125 mg/dL — ABNORMAL HIGH (ref 0–99)
Total CHOL/HDL Ratio: 3 Ratio
Triglycerides: 82 mg/dL (ref <150)
VLDL: 16 mg/dL (ref 0–40)

## 2014-06-06 LAB — T4, FREE: FREE T4: 1.34 ng/dL (ref 0.80–1.80)

## 2014-06-06 LAB — VITAMIN B12: Vitamin B-12: 527 pg/mL (ref 211–911)

## 2014-06-06 LAB — TSH: TSH: <0.008 u[IU]/mL — ABNORMAL LOW (ref 0.350–4.500)

## 2014-06-07 ENCOUNTER — Other Ambulatory Visit: Payer: Self-pay | Admitting: Physician Assistant

## 2014-06-10 ENCOUNTER — Telehealth: Payer: Self-pay | Admitting: *Deleted

## 2014-06-10 DIAGNOSIS — Z803 Family history of malignant neoplasm of breast: Secondary | ICD-10-CM

## 2014-06-10 DIAGNOSIS — C73 Malignant neoplasm of thyroid gland: Secondary | ICD-10-CM

## 2014-06-10 LAB — FERRITIN: Ferritin: 20 ng/mL (ref 10–291)

## 2014-06-10 LAB — IRON AND TIBC
%SAT: 36 % (ref 20–55)
Iron: 128 ug/dL (ref 42–145)
TIBC: 355 ug/dL (ref 250–470)
UIBC: 227 ug/dL (ref 125–400)

## 2014-06-10 LAB — MAGNESIUM: MAGNESIUM: 1.9 mg/dL (ref 1.5–2.5)

## 2014-06-10 LAB — VITAMIN D 25 HYDROXY (VIT D DEFICIENCY, FRACTURES): Vit D, 25-Hydroxy: 52 ng/mL (ref 30–89)

## 2014-06-11 ENCOUNTER — Other Ambulatory Visit: Payer: Self-pay | Admitting: Physician Assistant

## 2014-06-11 DIAGNOSIS — R7989 Other specified abnormal findings of blood chemistry: Secondary | ICD-10-CM

## 2014-06-11 DIAGNOSIS — E039 Hypothyroidism, unspecified: Secondary | ICD-10-CM

## 2014-06-11 DIAGNOSIS — C73 Malignant neoplasm of thyroid gland: Secondary | ICD-10-CM

## 2014-06-11 NOTE — Progress Notes (Signed)
Left msg for patient to continue same med doseage and to return call to office regarding endocrinology referral. Margette Fast, [place]

## 2014-07-12 ENCOUNTER — Encounter: Payer: 59 | Attending: Physician Assistant | Admitting: Dietician

## 2014-07-12 DIAGNOSIS — Z713 Dietary counseling and surveillance: Secondary | ICD-10-CM | POA: Insufficient documentation

## 2014-07-12 DIAGNOSIS — E663 Overweight: Secondary | ICD-10-CM | POA: Insufficient documentation

## 2014-07-12 DIAGNOSIS — Z6827 Body mass index (BMI) 27.0-27.9, adult: Secondary | ICD-10-CM | POA: Insufficient documentation

## 2014-07-16 NOTE — Progress Notes (Signed)
Patient was seen on 07/12/2014 for the Weight Loss Class at the [facility]. The following learning objectives were met by the patient during this class:   Describe healthy choices in each food group  Describe portion size of foods  Use plate method for meal planning  Demonstrate how to read Nutrition Facts food label  Set realistic goals for weight loss, diet changes, and physical activity.   Goals:  1. Make healthy food choices in each food group.  2. Reduce portion size of foods.  3. Increase fruit and vegetable intake.  4. Use plate method for meal planning.  5. Increase physical activity.   Handouts given:  1. Weight loss tips 2. Meal plan/portion card 2. Plate method  2. Food label handout

## 2014-07-28 ENCOUNTER — Ambulatory Visit (INDEPENDENT_AMBULATORY_CARE_PROVIDER_SITE_OTHER): Payer: 59 | Admitting: Endocrinology

## 2014-07-28 ENCOUNTER — Encounter: Payer: Self-pay | Admitting: Endocrinology

## 2014-07-28 VITALS — BP 132/84 | HR 92 | Temp 98.2°F | Ht 67.0 in | Wt 176.0 lb

## 2014-07-28 DIAGNOSIS — C73 Malignant neoplasm of thyroid gland: Secondary | ICD-10-CM

## 2014-07-28 DIAGNOSIS — E039 Hypothyroidism, unspecified: Secondary | ICD-10-CM

## 2014-07-28 MED ORDER — LEVOTHYROXINE SODIUM 150 MCG PO TABS: 150.0000 ug | ORAL_TABLET | Freq: Every day | ORAL | Status: DC

## 2014-07-28 NOTE — Progress Notes (Signed)
Subjective:    Patient ID: Michelle Browning, female    DOB: 1971-04-18, 43 y.o.   MRN: [id number]  HPI Pt is here for rx of thyroid cancer, stage 1, with this chronology: 2002 thyroid bx for suspicious nodule.   2002: thyroidectomy: papillary cancer (T2x, N1x, Mx).   2003 I-131 rx, 153 mCi 10/03: post-therapy scan: clear 3/04: I-131 rx, 175 mci 5/06: I-131 rx, 216 mci 2007: TG=9.7 (Ab=2.4) 2007: neck US neg.  2010: TG=2.7 (Ab neg).  2013 PET scan neg.  Pt has moderately excessive diaphoresis throughout the body, and assoc weight gain. Past Medical History  Diagnosis Date  . Thyroid disease   . Depression     No past surgical history on file.  History   Social History  . Marital Status: Married    Spouse Name: N/A    Number of Children: N/A  . Years of Education: N/A   Occupational History  . Not on file.   Social History Main Topics  . Smoking status: Former Research scientist (life sciences)  . Smokeless tobacco: Not on file  . Alcohol Use: Yes  . Drug Use: No  . Sexual Activity: Not on file   Other Topics Concern  . Not on file   Social History Narrative  . No narrative on file    Current Outpatient Prescriptions on File Prior to Visit  Medication Sig Dispense Refill  . buPROPion (WELLBUTRIN XL) 300 MG 24 hr tablet TAKE 1 TABLET BY MOUTH DAILY.  90 tablet  2  . escitalopram (LEXAPRO) 20 MG tablet TAKE 1 TABLET BY MOUTH DAILY.  90 tablet  2   No current facility-administered medications on file prior to visit.    No Known Allergies  No family history on file.  BP 132/84  Pulse 92  Temp(Src) 98.2 F (36.8 C) (Oral)  Ht 5\' 7"  (1.702 m)  Wt 176 lb (79.833 kg)  BMI 27.56 kg/m2  SpO2 98%  Review of Systems denies fever, headache, hoarseness, double vision, dry skin, palpitations, sob, diarrhea, polyuria, myalgias, tremor, anxiety, and rhinorrhea. She has left shoulder pain and easy bruising.  She has regular menses.     Objective:   Physical Exam VS: see vs page GEN:  no distress HEAD: head: no deformity eyes: no periorbital swelling, no proptosis external nose and ears are normal mouth: no lesion seen NECK: a healed scar is present.  i do not appreciate a nodule in the thyroid or elsewhere in the neck CHEST WALL: no deformity LUNGS:  Clear to auscultation CV: reg rate and rhythm, no murmur MUSCULOSKELETAL: muscle bulk and strength are grossly normal.  no obvious joint swelling.  gait is normal and steady EXTEMITIES: no deformity.  no ulcer on the feet.  feet are of normal color and temp.  no edema PULSES: dorsalis pedis intact bilat.  no carotid bruit NEURO:  cn 2-12 grossly intact.   readily moves all 4's.  sensation is intact to touch on the feet SKIN:  Normal texture and temperature.  No rash or suspicious lesion is visible.   NODES:  None palpable at the neck PSYCH: alert, well-oriented.  Does not appear anxious nor depressed.  Lab Results  Component Value Date   TSH <0.008* 06/05/2014   T3TOTAL 85.8 04/18/2012   T4TOTAL 12.2 04/18/2012   i have reviewed the following outside records: Office notes  i have reviewed the following radiol reports: PET scan, nuc med, and [place]     Assessment & Plan:  Papillary adenocarcinoma  of the thyroid, with several local recurrences, despite being stage-1. Shoulder pain, new, unlikely cancer-related. Postsurgical hypothyroidism: overreplaced, but she still needs a slightly suppressed TSH. Excessive diaphoresis: this could be due to synthroid.     Patient is advised the following: Patient Instructions  Please slightly reduce the thyroid medication. Please repeat the thyroid blood tests in 4-6 weeks. Please let me know if you want to have an x-ray of your left shoulder. Please come back for a follow-up appointment in 6 months.

## 2014-07-28 NOTE — Patient Instructions (Addendum)
Please slightly reduce the thyroid medication. Please repeat the thyroid blood tests in 4-6 weeks. Please let me know if you want to have an x-ray of your left shoulder. Please come back for a follow-up appointment in 6 months.

## 2014-07-30 ENCOUNTER — Telehealth: Payer: Self-pay

## 2014-07-30 NOTE — Telephone Encounter (Signed)
Patient called and left a message on nurse line asking for a return call.   Returned Call: Left message asking patient to call back.  

## 2014-07-31 ENCOUNTER — Other Ambulatory Visit: Payer: Self-pay | Admitting: Physician Assistant

## 2014-07-31 DIAGNOSIS — N644 Mastodynia: Secondary | ICD-10-CM

## 2014-07-31 DIAGNOSIS — Z803 Family history of malignant neoplasm of breast: Secondary | ICD-10-CM

## 2014-07-31 NOTE — Telephone Encounter (Signed)
Rorey states she has pain in her left breast. She is due for a Mammogram. I printed the order that is already in the system. She will still need an order for left breast Ultra Sound.

## 2014-09-01 ENCOUNTER — Other Ambulatory Visit: Payer: 59

## 2015-01-19 IMAGING — CR DG RIBS W/ CHEST 3+V*R*
3 series · 3 of 3 positions shown · non-contrast
Comparison: Chest x-ray of 04/21/2006

CLINICAL DATA: Fell down steps, right-sided rib pain

RIGHT RIBS AND CHEST - 3+ VIEW

[w chest pa]
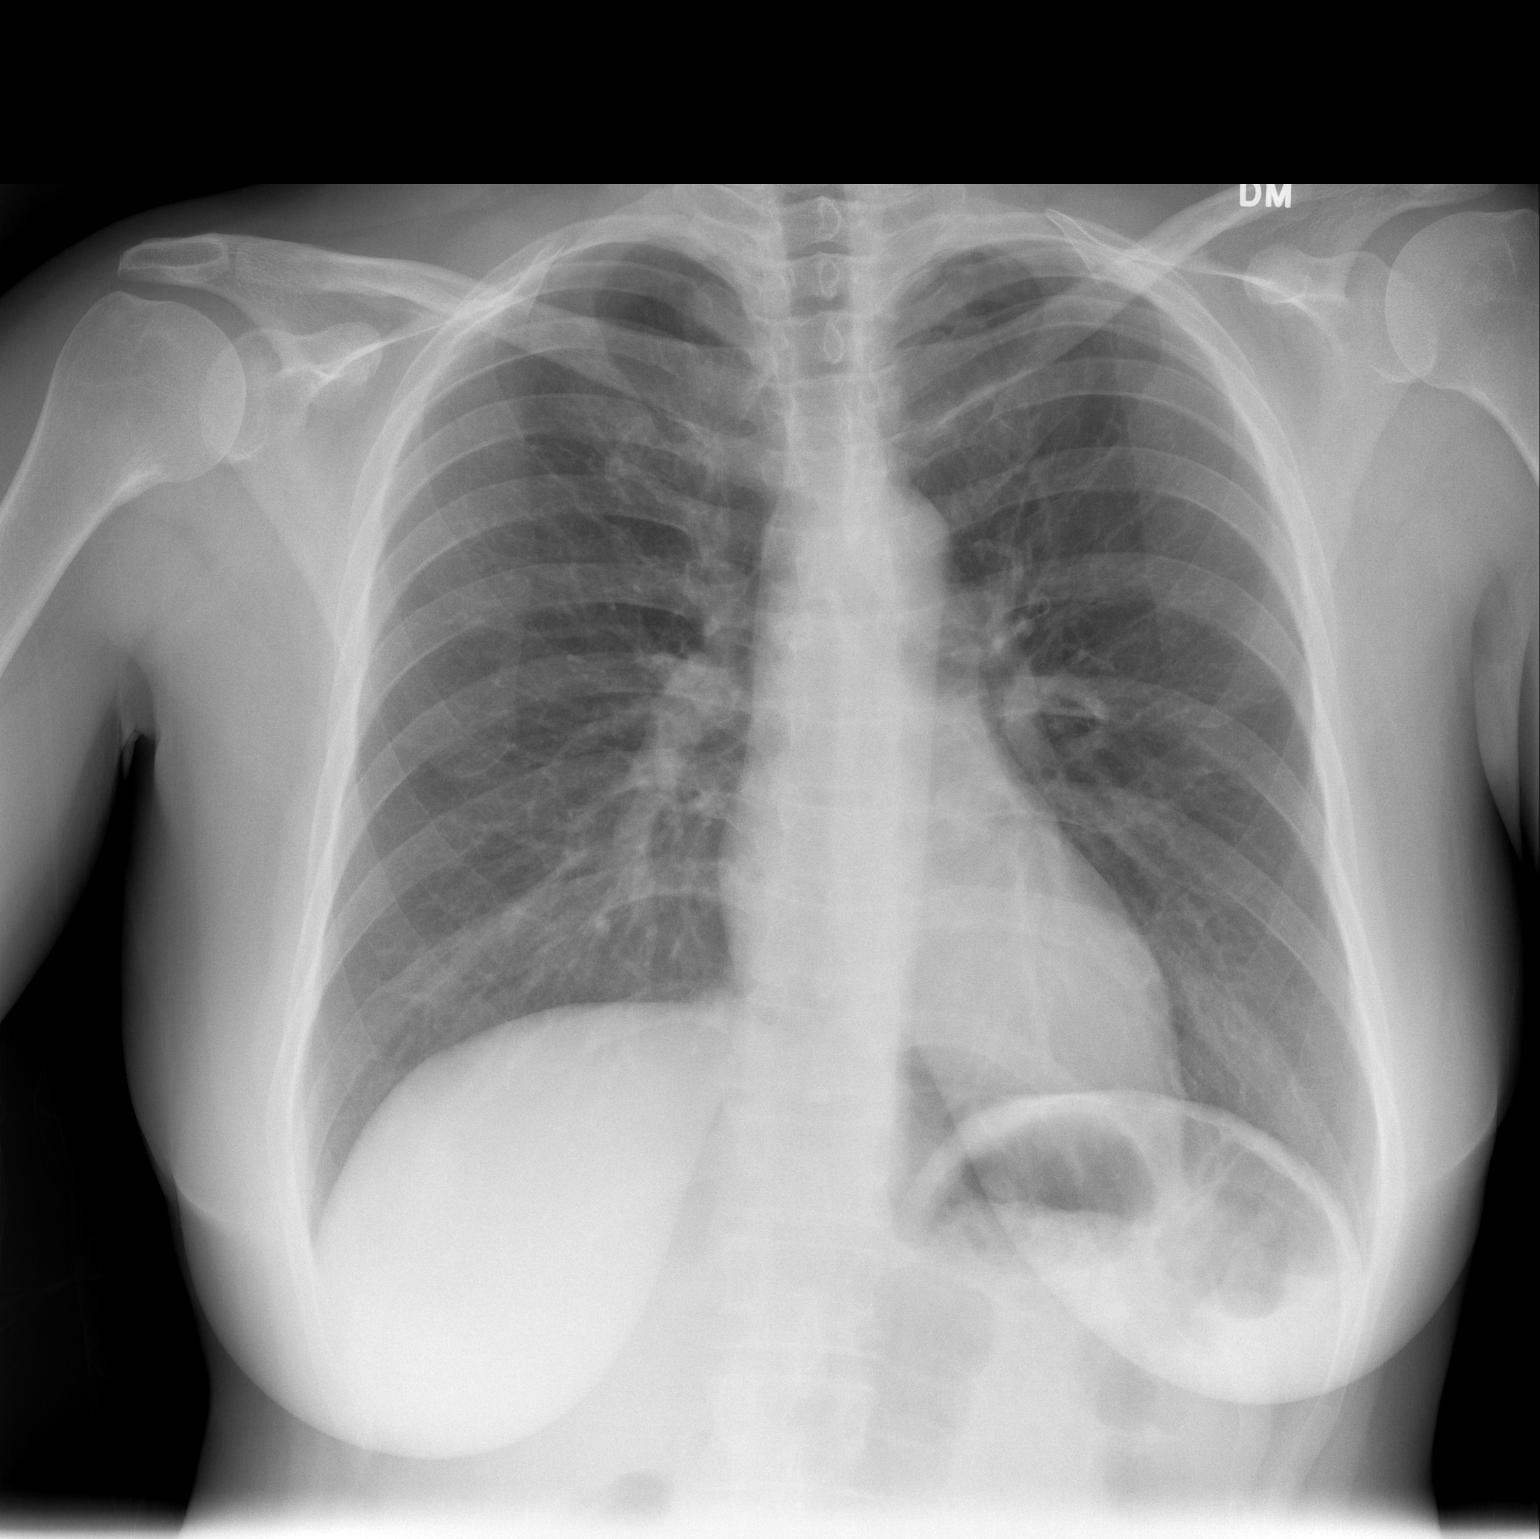

[w ribs ap/pa upper right]
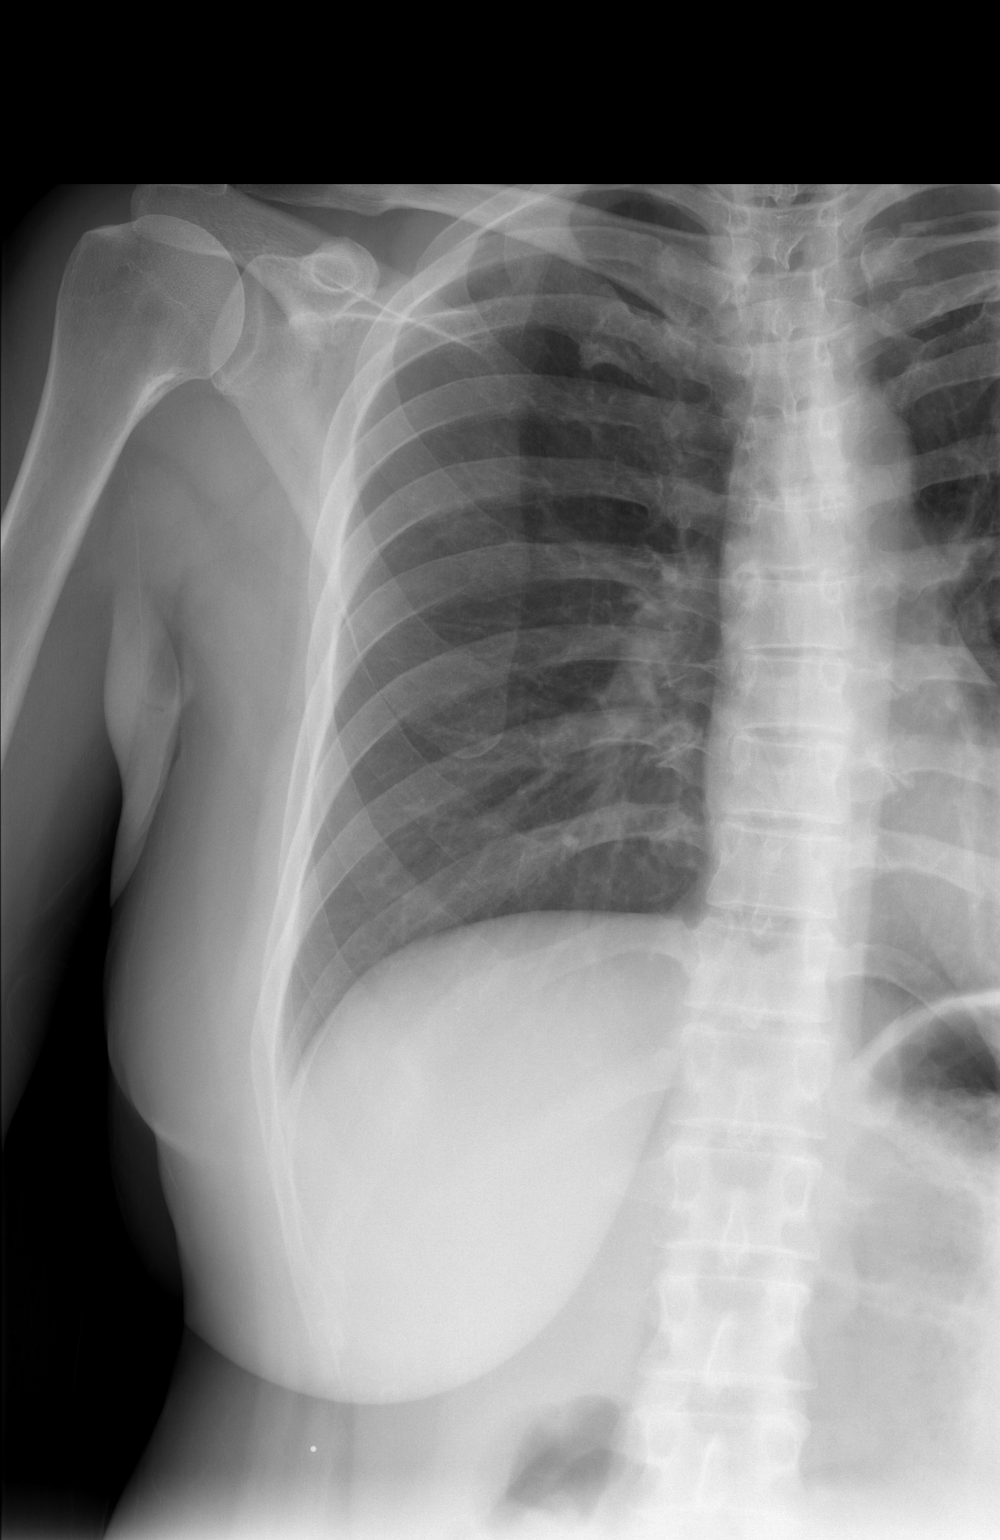

[w ribs oblique right]
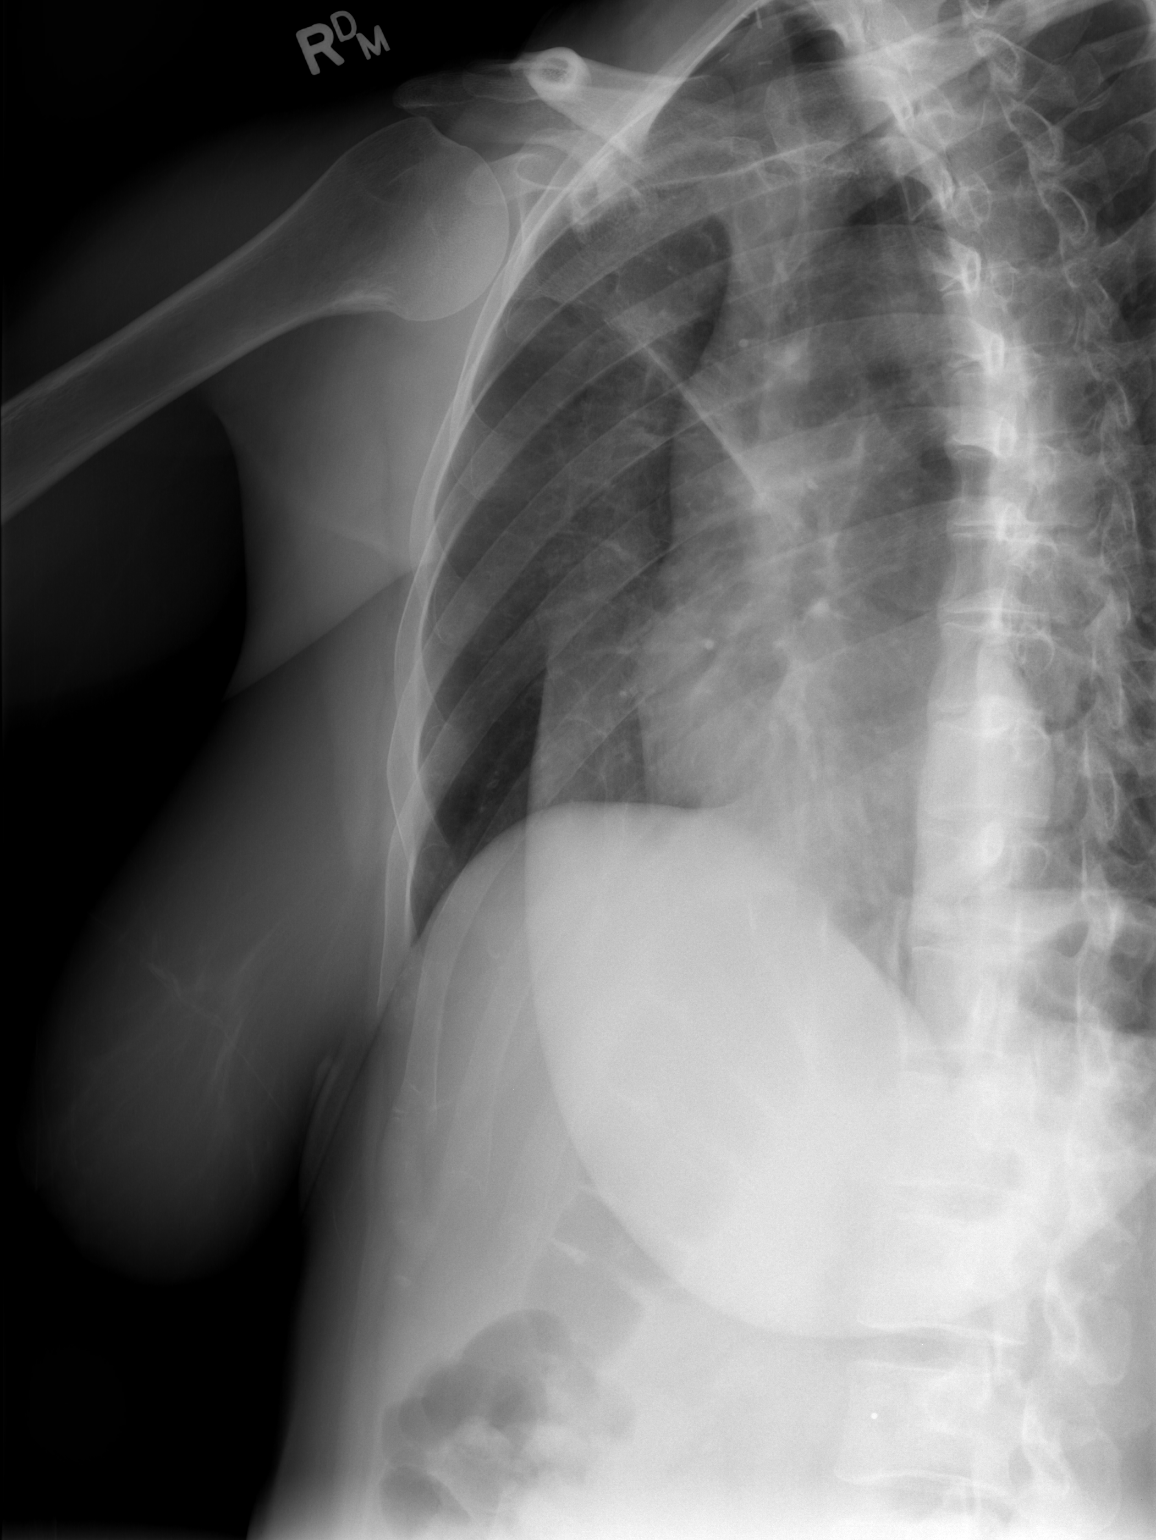

[3 of 3 positions shown; findings below may reference images not displayed]

FINDINGS: No active infiltrate or effusion is seen.  Mediastinal
contours appear normal.  The heart is within normal limits in size.

Right rib detail and show no acute right rib fracture.
IMPRESSION: No active lung disease.  Native right rib detail.

## 2015-01-28 ENCOUNTER — Telehealth: Payer: Self-pay | Admitting: Endocrinology

## 2015-01-28 ENCOUNTER — Ambulatory Visit: Payer: 59 | Admitting: Endocrinology

## 2015-01-28 NOTE — Telephone Encounter (Signed)
Patient no showed today's appt. Please advise on how to follow up. °A. No follow up necessary. °B. Follow up urgent. Contact patient immediately. °C. Follow up necessary. Contact patient and schedule visit in ___ days. °D. Follow up advised. Contact patient and schedule visit in ____weeks. ° °

## 2015-01-29 NOTE — Telephone Encounter (Signed)
Follow up advised. Contact patient and schedule visit in _1-2___weeks 

## 2015-01-29 NOTE — Telephone Encounter (Signed)
Patient was very rude, she did not want to reschedule.

## 2015-03-26 ENCOUNTER — Other Ambulatory Visit: Payer: Self-pay | Admitting: Physician Assistant

## 2015-03-27 ENCOUNTER — Ambulatory Visit (INDEPENDENT_AMBULATORY_CARE_PROVIDER_SITE_OTHER): Payer: 59 | Admitting: Physician Assistant

## 2015-03-27 ENCOUNTER — Encounter: Payer: Self-pay | Admitting: Physician Assistant

## 2015-03-27 VITALS — BP 128/90 | HR 94 | Wt 171.0 lb

## 2015-03-27 DIAGNOSIS — E663 Overweight: Secondary | ICD-10-CM | POA: Diagnosis not present

## 2015-03-27 DIAGNOSIS — Z1231 Encounter for screening mammogram for malignant neoplasm of breast: Secondary | ICD-10-CM

## 2015-03-27 DIAGNOSIS — F418 Other specified anxiety disorders: Secondary | ICD-10-CM | POA: Diagnosis not present

## 2015-03-27 DIAGNOSIS — F329 Major depressive disorder, single episode, unspecified: Secondary | ICD-10-CM

## 2015-03-27 DIAGNOSIS — F419 Anxiety disorder, unspecified: Principal | ICD-10-CM

## 2015-03-27 MED ORDER — PHENTERMINE HCL 37.5 MG PO TABS: 37.5000 mg | ORAL_TABLET | Freq: Every day | ORAL | Status: DC

## 2015-03-27 NOTE — Progress Notes (Signed)
   Subjective:    Patient ID: Michelle Browning, female    DOB: 03/21/1971, 44 y.o.   MRN: [id number]  HPI  Pt presents to the clinic for medication refills.   Anxiety and depression- doing great. No problems or concerns. Taking medication daily.  No suicidal or homicidal thougths.   Overweight- struggling with weight loss. Feels like she is exercising and watching her calories and not losing any weight. Not tried medication in the past. TSH normal via visit with endocriniologist.     Review of Systems  All other systems reviewed and are negative.      Objective:   Physical Exam  Constitutional: She is oriented to person, place, and time. She appears well-developed and well-nourished.  Overweight.   HENT:  Head: Normocephalic and atraumatic.  Cardiovascular: Normal rate, regular rhythm and normal heart sounds.   Pulmonary/Chest: Effort normal and breath sounds normal.  Neurological: She is alert and oriented to person, place, and time.  Skin: Skin is dry.  Psychiatric: She has a normal mood and affect. Her behavior is normal.          Assessment & Plan:    Anxiety and Depression- GAD-7 was 2. PHQ-9 was 1. EMR shows 90 day refill with 2 refills sent yesterday. Of for when pt calls back to give 1 year follow up. Pt has been stable for over a year with no medication changes.   Overweight- discussed with patient she does not have aq lot of weight to lose. Discussed options. Encouraged some interval training and diet changes.  Will try phentermine. Discussed side effects. Follow up in 1 month for nurse visit and vital recheck.    Needs mammogram. Ordered. Schedule CPE for fall.

## 2015-03-29 DIAGNOSIS — E663 Overweight: Secondary | ICD-10-CM | POA: Insufficient documentation

## 2015-04-24 ENCOUNTER — Encounter: Payer: 59 | Admitting: Family Medicine

## 2015-12-25 ENCOUNTER — Ambulatory Visit (INDEPENDENT_AMBULATORY_CARE_PROVIDER_SITE_OTHER): Payer: 59 | Admitting: Physician Assistant

## 2015-12-25 ENCOUNTER — Encounter: Payer: Self-pay | Admitting: Physician Assistant

## 2015-12-25 VITALS — BP 121/80 | HR 95 | Ht 67.0 in | Wt 192.0 lb

## 2015-12-25 DIAGNOSIS — R41840 Attention and concentration deficit: Secondary | ICD-10-CM

## 2015-12-25 DIAGNOSIS — R635 Abnormal weight gain: Secondary | ICD-10-CM | POA: Insufficient documentation

## 2015-12-25 DIAGNOSIS — Z6830 Body mass index (BMI) 30.0-30.9, adult: Secondary | ICD-10-CM

## 2015-12-25 DIAGNOSIS — Z1239 Encounter for other screening for malignant neoplasm of breast: Secondary | ICD-10-CM | POA: Diagnosis not present

## 2015-12-25 DIAGNOSIS — E6609 Other obesity due to excess calories: Secondary | ICD-10-CM | POA: Insufficient documentation

## 2015-12-25 DIAGNOSIS — E669 Obesity, unspecified: Secondary | ICD-10-CM

## 2015-12-25 MED ORDER — TOPIRAMATE 50 MG PO TABS: ORAL_TABLET | ORAL | Status: DC

## 2015-12-25 MED ORDER — PHENTERMINE HCL 37.5 MG PO TABS: 37.5000 mg | ORAL_TABLET | Freq: Every day | ORAL | Status: DC

## 2015-12-25 NOTE — Progress Notes (Signed)
   Subjective:    Patient ID: Michelle Browning, female    DOB: Feb 18, 1971, 45 y.o.   MRN: [id number]  HPI Patient is a 45 -year-old female who presents to the clinic to discuss weight loss. She's gained over 20 pounds in the last year. She was given phentermine at one point but never started it. She is ready to be consistent with a diet and exercise regimen.  Patient also has brought her child in for ADHD symptoms. She mentions that she herself has had ADHD symptoms for her whole life. She continues to struggle at work with productivity and finishing tasks. She reports being easily distracted. She thinks she would benefit from testing as well.   Review of Systems  All other systems reviewed and are negative.      Objective:   Physical Exam  Constitutional: She is oriented to person, place, and time. She appears well-developed and well-nourished.  Obese.   HENT:  Head: Normocephalic and atraumatic.  Cardiovascular: Normal rate, regular rhythm and normal heart sounds.   Pulmonary/Chest: Effort normal and breath sounds normal.  Neurological: She is alert and oriented to person, place, and time.  Skin: Skin is dry.  Psychiatric: She has a normal mood and affect. Her behavior is normal.          Assessment & Plan:  Obese/abnormal weight gain- started phentermine and topamax. Discussed side effects. Making diet and exercise plans. Keep to 1500 calorie diet. Follow up in 1 month with nurse visit.   Mammogram ordered.   Inattention- will refer for testing. Phentermine will be a good trial to see if helps with work production and finishing task.

## 2016-01-07 ENCOUNTER — Other Ambulatory Visit: Payer: Self-pay | Admitting: Physician Assistant

## 2016-01-07 MED FILL — SYNTHROID 175 MCG TABLET: 175 | 30 days supply | Qty: 30 | Fill #0

## 2016-01-07 MED FILL — BUPROPION HCL XL 300 MG TAB: 300 | 30 days supply | Qty: 30 | Fill #0

## 2016-01-07 MED FILL — ESCITALOPRAM 20 MG TABLET: 20 | 30 days supply | Qty: 30 | Fill #0

## 2016-01-27 ENCOUNTER — Encounter: Payer: Self-pay | Admitting: Physician Assistant

## 2016-01-27 ENCOUNTER — Ambulatory Visit (INDEPENDENT_AMBULATORY_CARE_PROVIDER_SITE_OTHER): Payer: 59 | Admitting: Psychology

## 2016-01-27 DIAGNOSIS — F422 Mixed obsessional thoughts and acts: Secondary | ICD-10-CM

## 2016-01-27 DIAGNOSIS — F429 Obsessive-compulsive disorder, unspecified: Secondary | ICD-10-CM | POA: Insufficient documentation

## 2016-01-27 DIAGNOSIS — F419 Anxiety disorder, unspecified: Secondary | ICD-10-CM | POA: Diagnosis not present

## 2016-01-27 DIAGNOSIS — F330 Major depressive disorder, recurrent, mild: Secondary | ICD-10-CM

## 2016-01-27 DIAGNOSIS — F900 Attention-deficit hyperactivity disorder, predominantly inattentive type: Secondary | ICD-10-CM

## 2016-01-27 DIAGNOSIS — F909 Attention-deficit hyperactivity disorder, unspecified type: Secondary | ICD-10-CM | POA: Insufficient documentation

## 2016-01-29 ENCOUNTER — Ambulatory Visit (INDEPENDENT_AMBULATORY_CARE_PROVIDER_SITE_OTHER): Payer: 59 | Admitting: Physician Assistant

## 2016-01-29 ENCOUNTER — Encounter: Payer: Self-pay | Admitting: Physician Assistant

## 2016-01-29 VITALS — BP 123/73 | HR 86 | Ht 67.0 in | Wt 180.0 lb

## 2016-01-29 DIAGNOSIS — F900 Attention-deficit hyperactivity disorder, predominantly inattentive type: Secondary | ICD-10-CM

## 2016-01-29 DIAGNOSIS — Z131 Encounter for screening for diabetes mellitus: Secondary | ICD-10-CM

## 2016-01-29 DIAGNOSIS — R635 Abnormal weight gain: Secondary | ICD-10-CM

## 2016-01-29 DIAGNOSIS — Z13220 Encounter for screening for lipoid disorders: Secondary | ICD-10-CM

## 2016-01-29 DIAGNOSIS — E039 Hypothyroidism, unspecified: Secondary | ICD-10-CM

## 2016-01-29 LAB — LIPID PANEL
CHOL/HDL RATIO: 3.2 ratio (ref <=5.0)
CHOLESTEROL: 229 mg/dL — AB (ref 125–200)
HDL: 72 mg/dL (ref >=46)
LDL Cholesterol: 136 mg/dL — ABNORMAL HIGH (ref <130)
Triglycerides: 106 mg/dL (ref <150)
VLDL: 21 mg/dL (ref <30)

## 2016-01-29 LAB — COMPLETE METABOLIC PANEL WITH GFR
ALBUMIN: 3.9 g/dL (ref 3.6–5.1)
ALK PHOS: 58 U/L (ref 33–115)
ALT: 19 U/L (ref 6–29)
AST: 21 U/L (ref 10–30)
BUN: 11 mg/dL (ref 7–25)
CALCIUM: 8.8 mg/dL (ref 8.6–10.2)
CO2: 23 mmol/L (ref 20–31)
Chloride: 104 mmol/L (ref 98–110)
Creat: 0.88 mg/dL (ref 0.50–1.10)
GFR, EST NON AFRICAN AMERICAN: 80 mL/min (ref >=60)
GFR, Est African American: >89 mL/min (ref >=60)
Glucose, Bld: 89 mg/dL (ref 65–99)
POTASSIUM: 4.2 mmol/L (ref 3.5–5.3)
SODIUM: 137 mmol/L (ref 135–146)
Total Bilirubin: 0.5 mg/dL (ref 0.2–1.2)
Total Protein: 7 g/dL (ref 6.1–8.1)

## 2016-01-29 MED ORDER — PHENTERMINE HCL 37.5 MG PO TABS: 37.5000 mg | ORAL_TABLET | Freq: Every day | ORAL | Status: DC

## 2016-01-29 MED ORDER — ESCITALOPRAM OXALATE 20 MG PO TABS: 20.0000 mg | ORAL_TABLET | Freq: Every day | ORAL | Status: DC

## 2016-01-29 MED ORDER — BUPROPION HCL ER (XL) 300 MG PO TB24: 300.0000 mg | ORAL_TABLET | Freq: Every day | ORAL | Status: DC

## 2016-01-30 LAB — TSH: TSH: 0.01 mIU/L — ABNORMAL LOW

## 2016-01-31 NOTE — Progress Notes (Signed)
   Subjective:    Patient ID: Michelle Browning, female    DOB: [birth year], 45 y.o.   MRN: [id number]  HPI Pt presents to the clinic to discuss ADHD results and weight loss.   Dr. Lurline Hare sent results positive for ADHD. Suggested treatment with stimulant.   Pt has been on phentermine and doing great. Lost 12lbs. Exercises a few times a week. She has noticed she can focus and get much more work done. This has helped her inattention greatly.    Review of Systems  All other systems reviewed and are negative.      Objective:   Physical Exam  Constitutional: She is oriented to person, place, and time. She appears well-developed and well-nourished.  HENT:  Head: Normocephalic and atraumatic.  Cardiovascular: Normal rate, regular rhythm and normal heart sounds.   Pulmonary/Chest: Effort normal and breath sounds normal.  Neurological: She is alert and oriented to person, place, and time.  Skin: Skin is dry.  Psychiatric: She has a normal mood and affect. Her behavior is normal.          Assessment & Plan:  ADHD- confirmed with formal testing. Will hold on stimulant since treating weight loss with phentermine. When she comes off phentermine will put on stimulant.   Abnormal weight gain- losing weight and doing great. Refilled phentermine with 1 month follow up. Encouraged to stop drinking diet sodas.   Fasting labs ordered. Discussed need for CPE.

## 2016-02-03 ENCOUNTER — Ambulatory Visit (INDEPENDENT_AMBULATORY_CARE_PROVIDER_SITE_OTHER): Payer: 59

## 2016-02-03 DIAGNOSIS — Z1231 Encounter for screening mammogram for malignant neoplasm of breast: Secondary | ICD-10-CM

## 2016-02-10 ENCOUNTER — Other Ambulatory Visit: Payer: Self-pay | Admitting: Physician Assistant

## 2016-02-10 MED FILL — ESCITALOPRAM 20 MG TABLET: 20 | 90 days supply | Qty: 90 | Fill #0

## 2016-02-10 MED FILL — BUPROPION HCL XL 300 MG TAB: 300 | 90 days supply | Qty: 90 | Fill #0

## 2016-02-11 ENCOUNTER — Other Ambulatory Visit: Payer: Self-pay | Admitting: *Deleted

## 2016-02-11 MED ORDER — LEVOTHYROXINE SODIUM 150 MCG PO TABS: 150.0000 ug | ORAL_TABLET | Freq: Every day | ORAL | Status: DC

## 2016-02-11 MED FILL — LEVOTHYROXINE 150 MCG TAB: 150 | 30 days supply | Qty: 30 | Fill #0

## 2016-02-26 ENCOUNTER — Ambulatory Visit: Payer: Self-pay

## 2016-03-23 MED FILL — LEVOTHYROXINE 150 MCG TAB: 150 | 30 days supply | Qty: 30 | Fill #1

## 2016-04-26 ENCOUNTER — Other Ambulatory Visit: Payer: Self-pay | Admitting: Physician Assistant

## 2016-04-27 MED FILL — LEVOTHYROXINE 150 MCG TAB: 150 | 30 days supply | Qty: 30 | Fill #0

## 2016-05-23 MED FILL — ESCITALOPRAM 20 MG TABLET: 20 | 90 days supply | Qty: 90 | Fill #1

## 2016-05-23 MED FILL — BUPROPION HCL XL 300 MG TAB: 300 | 90 days supply | Qty: 90 | Fill #1

## 2016-05-23 MED FILL — LEVOTHYROXINE 150 MCG TAB: 150 | 30 days supply | Qty: 30 | Fill #1

## 2016-07-05 ENCOUNTER — Other Ambulatory Visit: Payer: Self-pay | Admitting: Physician Assistant

## 2016-07-06 ENCOUNTER — Other Ambulatory Visit: Payer: Self-pay | Admitting: Physician Assistant

## 2016-07-06 ENCOUNTER — Other Ambulatory Visit: Payer: Self-pay | Admitting: *Deleted

## 2016-07-06 ENCOUNTER — Telehealth: Payer: Self-pay | Admitting: *Deleted

## 2016-07-06 MED ORDER — AMPHETAMINE-DEXTROAMPHET ER 20 MG PO CP24: 20.0000 mg | ORAL_CAPSULE | ORAL | 0 refills | Status: DC

## 2016-07-06 MED ORDER — LEVOTHYROXINE SODIUM 150 MCG PO TABS: 150.0000 ug | ORAL_TABLET | Freq: Every day | ORAL | 1 refills | Status: DC

## 2016-07-06 MED FILL — LEVOTHYROXINE 150 MCG TAB: 150 | 30 days supply | Qty: 30 | Fill #0

## 2016-07-06 NOTE — Telephone Encounter (Signed)
Pt called and would like to go ahead and start ADHD medication.

## 2016-07-06 NOTE — Telephone Encounter (Signed)
Call pt: ready for pick up. Just gave first month to make sure this dose works the best. Once we get a good dose can give 3 months at a time.

## 2016-07-07 MED FILL — DEXTROAMP-AMPHET ER 20 MG C: 20 | 30 days supply | Qty: 30 | Fill #0

## 2016-08-05 ENCOUNTER — Encounter: Payer: Self-pay | Admitting: Physician Assistant

## 2016-08-05 ENCOUNTER — Ambulatory Visit (INDEPENDENT_AMBULATORY_CARE_PROVIDER_SITE_OTHER): Payer: 59 | Admitting: Physician Assistant

## 2016-08-05 VITALS — BP 124/79 | HR 82 | Ht 67.0 in | Wt 185.0 lb

## 2016-08-05 DIAGNOSIS — E039 Hypothyroidism, unspecified: Secondary | ICD-10-CM | POA: Diagnosis not present

## 2016-08-05 DIAGNOSIS — F419 Anxiety disorder, unspecified: Secondary | ICD-10-CM

## 2016-08-05 DIAGNOSIS — F900 Attention-deficit hyperactivity disorder, predominantly inattentive type: Secondary | ICD-10-CM | POA: Diagnosis not present

## 2016-08-05 DIAGNOSIS — F32A Depression, unspecified: Secondary | ICD-10-CM

## 2016-08-05 DIAGNOSIS — F329 Major depressive disorder, single episode, unspecified: Secondary | ICD-10-CM

## 2016-08-05 DIAGNOSIS — C73 Malignant neoplasm of thyroid gland: Secondary | ICD-10-CM

## 2016-08-05 DIAGNOSIS — R7989 Other specified abnormal findings of blood chemistry: Secondary | ICD-10-CM

## 2016-08-05 DIAGNOSIS — E785 Hyperlipidemia, unspecified: Secondary | ICD-10-CM | POA: Diagnosis not present

## 2016-08-05 DIAGNOSIS — F418 Other specified anxiety disorders: Secondary | ICD-10-CM

## 2016-08-05 DIAGNOSIS — R946 Abnormal results of thyroid function studies: Secondary | ICD-10-CM | POA: Diagnosis not present

## 2016-08-05 LAB — COMPLETE METABOLIC PANEL WITH GFR
ALBUMIN: 4.3 g/dL (ref 3.6–5.1)
ALK PHOS: 61 U/L (ref 33–115)
ALT: 12 U/L (ref 6–29)
AST: 16 U/L (ref 10–35)
BILIRUBIN TOTAL: 0.7 mg/dL (ref 0.2–1.2)
BUN: 10 mg/dL (ref 7–25)
CALCIUM: 8.6 mg/dL (ref 8.6–10.2)
CO2: 24 mmol/L (ref 20–31)
CREATININE: 1.01 mg/dL (ref 0.50–1.10)
Chloride: 104 mmol/L (ref 98–110)
GFR, EST AFRICAN AMERICAN: 78 mL/min (ref >=60)
GFR, EST NON AFRICAN AMERICAN: 67 mL/min (ref >=60)
Glucose, Bld: 87 mg/dL (ref 65–99)
Potassium: 4 mmol/L (ref 3.5–5.3)
Sodium: 136 mmol/L (ref 135–146)
TOTAL PROTEIN: 7.2 g/dL (ref 6.1–8.1)

## 2016-08-05 LAB — LIPID PANEL
CHOLESTEROL: 258 mg/dL — AB (ref 125–200)
HDL: 87 mg/dL (ref >=46)
LDL CALC: 143 mg/dL — AB (ref <130)
TRIGLYCERIDES: 142 mg/dL (ref <150)
Total CHOL/HDL Ratio: 3 Ratio (ref <=5.0)
VLDL: 28 mg/dL (ref <30)

## 2016-08-05 LAB — TSH: TSH: 0.01 m[IU]/L — AB

## 2016-08-05 MED ORDER — ESCITALOPRAM OXALATE 20 MG PO TABS: 20.0000 mg | ORAL_TABLET | Freq: Every day | ORAL | 1 refills | Status: DC

## 2016-08-05 MED ORDER — LISDEXAMFETAMINE DIMESYLATE 40 MG PO CAPS: 40.0000 mg | ORAL_CAPSULE | ORAL | 0 refills | Status: DC

## 2016-08-05 MED ORDER — BUPROPION HCL ER (XL) 300 MG PO TB24: 300.0000 mg | ORAL_TABLET | Freq: Every day | ORAL | 1 refills | Status: DC

## 2016-08-05 MED FILL — BUPROPION HCL XL 300 MG TAB: 300 | 90 days supply | Qty: 90 | Fill #0

## 2016-08-05 MED FILL — ESCITALOPRAM 20 MG TABLET: 20 | 90 days supply | Qty: 90 | Fill #0

## 2016-08-05 NOTE — Progress Notes (Signed)
   Subjective:    Patient ID: Michelle Browning, female    DOB: Feb 01, 1971, 45 y.o.   MRN: [id number]  HPI Pt is a 45 yo female who presents to the clinic for 3 month ADHD follow up. She declines any side effects of insomnia, anxiety, palpitations. She does not feel like adderall xr 20mg  it is helping much either. She continues to feel like she cannot finish task and mind all over the place.   Follow up hypothyroidism- hx of thyroid cancer. Needs to see endocrinologist. On synthyroid. TSH was low last check.   Follow up anxiety and depression- feels controlled. No problems. No side effects. On lexapro and wellbutrin.    Review of Systems  All other systems reviewed and are negative.      Objective:   Physical Exam  Constitutional: She is oriented to person, place, and time. She appears well-developed and well-nourished.  HENT:  Head: Normocephalic and atraumatic.  Neck: Normal range of motion. Neck supple.  Cardiovascular: Normal rate, regular rhythm and normal heart sounds.   Pulmonary/Chest: Effort normal and breath sounds normal.  Lymphadenopathy:    She has no cervical adenopathy.  Neurological: She is alert and oriented to person, place, and time.  Psychiatric: She has a normal mood and affect. Her behavior is normal.          Assessment & Plan:  ADHD- will try changing to vyvanse 40mg  one month given.   Hypothyroidism/decreased TSH level- will recheck TSH. Will adjust accordingly. Will make referral to [place]. To follow up on thyroid cancer.   Anxiety/depression- controlled, lexapro and wellbutrin refilled.   Hyperlipidemia- ordered lipid level.

## 2016-08-10 ENCOUNTER — Other Ambulatory Visit: Payer: Self-pay | Admitting: Physician Assistant

## 2016-08-10 MED FILL — VYVANSE 40 MG CAPSULE: 40 | 30 days supply | Qty: 30 | Fill #0

## 2016-08-10 MED FILL — LEVOTHYROXINE 150 MCG TAB: 150 | 90 days supply | Qty: 90 | Fill #0

## 2016-09-16 ENCOUNTER — Other Ambulatory Visit: Payer: Self-pay

## 2016-09-16 MED ORDER — LISDEXAMFETAMINE DIMESYLATE 40 MG PO CAPS: 40.0000 mg | ORAL_CAPSULE | ORAL | 0 refills | Status: DC

## 2016-09-23 MED FILL — VYVANSE 40 MG CAPSULE: 40 | 30 days supply | Qty: 30 | Fill #0

## 2016-11-04 ENCOUNTER — Encounter: Payer: Self-pay | Admitting: Physician Assistant

## 2016-11-04 ENCOUNTER — Ambulatory Visit (INDEPENDENT_AMBULATORY_CARE_PROVIDER_SITE_OTHER): Payer: 59 | Admitting: Physician Assistant

## 2016-11-04 VITALS — BP 149/84 | HR 81 | Ht 67.0 in | Wt 184.0 lb

## 2016-11-04 DIAGNOSIS — F900 Attention-deficit hyperactivity disorder, predominantly inattentive type: Secondary | ICD-10-CM

## 2016-11-04 DIAGNOSIS — R7989 Other specified abnormal findings of blood chemistry: Secondary | ICD-10-CM

## 2016-11-04 DIAGNOSIS — R946 Abnormal results of thyroid function studies: Secondary | ICD-10-CM | POA: Diagnosis not present

## 2016-11-04 MED ORDER — LISDEXAMFETAMINE DIMESYLATE 50 MG PO CAPS: 50.0000 mg | ORAL_CAPSULE | Freq: Every day | ORAL | 0 refills | Status: DC

## 2016-11-04 NOTE — Progress Notes (Signed)
   Subjective:    Patient ID: Michelle Browning, female    DOB: Nov 21, 1971, 45 y.o.   MRN: [id number]  HPI  altabet- referll    Review of Systems     Objective:   Physical Exam        Assessment & Plan:

## 2016-11-04 NOTE — Progress Notes (Addendum)
   Subjective:    Patient ID: Michelle Browning, female    DOB: 1970/12/21, 45 y.o.   MRN: [id number]  HPI Patient is a 45 yo female for a follow-up for ADHD. Patient reports that her concentration is a lot better on Vyvanse than when she was prescibed Adderall. However, she reports that she takes Vyvanse around 5am daily, she feels like the medication begins wearing off around 4-5pm each day. Patient denies any adverse reactions to the medication.   Patient also reports not taking her lower dose of levothyroxine 150 MCG due to patient having three months worth of levothyroxine 175 MCG. Patient reports that she will begin taking the 150 MCG after she picks up the levothyroxine at the pharmacy today.    Review of Systems See HPI     Objective Blood pressure (!) 149/84, pulse 81, height 5\' 7"  (1.702 m), weight 184 lb (83.5 kg).   Physical Exam  Constitutional: She appears well-developed and well-nourished. No distress.  Cardiovascular: Normal rate and regular rhythm.        Assessment & Plan:  Michelle Browning was seen today for adhd.  Diagnoses and all orders for this visit:  1. Attention deficit hyperactivity disorder (ADHD), predominantly inattentive type -  Refilled lisdexamfetamine (VYVANSE) 50 MG capsule; Take 1 capsule (50 mg total) by mouth daily for three months. Increased the dose from 40 MG to 50 MG due to patient reporting a decrease in concentration around 4-pm on a daily basis. -Follow-up in three months to re-evaluate.    2. Decreased thyroid stimulating hormone level -Advised patient to begin levothyroxine 150 MCG.  -Due to patient not switching from 175 MG of Levothyroxine to 150 MCG, will re-check TSH when patient comes back to follow-up in three months.

## 2016-11-07 MED FILL — VYVANSE 50 MG CAPSULE: 50 | 30 days supply | Qty: 30 | Fill #0

## 2016-11-21 MED FILL — BUPROPION HCL XL 300 MG TAB: 300 | 90 days supply | Qty: 90 | Fill #1

## 2016-11-21 MED FILL — ESCITALOPRAM 20 MG TABLET: 20 | 90 days supply | Qty: 90 | Fill #1

## 2016-11-21 MED FILL — LEVOTHYROXINE 150 MCG TAB: 150 | 30 days supply | Qty: 30 | Fill #1

## 2016-12-16 MED FILL — VYVANSE 50 MG CAPSULE: 50 | 30 days supply | Qty: 30 | Fill #0

## 2017-02-03 ENCOUNTER — Ambulatory Visit: Payer: Self-pay | Admitting: Physician Assistant

## 2017-02-03 DIAGNOSIS — H524 Presbyopia: Secondary | ICD-10-CM | POA: Diagnosis not present

## 2017-02-03 DIAGNOSIS — H5203 Hypermetropia, bilateral: Secondary | ICD-10-CM | POA: Diagnosis not present

## 2017-02-03 DIAGNOSIS — H16223 Keratoconjunctivitis sicca, not specified as Sjogren's, bilateral: Secondary | ICD-10-CM | POA: Diagnosis not present

## 2017-03-06 DIAGNOSIS — F329 Major depressive disorder, single episode, unspecified: Secondary | ICD-10-CM | POA: Diagnosis not present

## 2017-03-06 DIAGNOSIS — Z008 Encounter for other general examination: Secondary | ICD-10-CM | POA: Diagnosis not present

## 2017-03-10 ENCOUNTER — Other Ambulatory Visit: Payer: Self-pay | Admitting: Physician Assistant

## 2017-03-10 MED FILL — VYVANSE 50 MG CAPSULE: 50 | 30 days supply | Qty: 30 | Fill #0

## 2017-03-13 MED FILL — ESCITALOPRAM 20 MG TABLET: 20 | 90 days supply | Qty: 90 | Fill #0

## 2017-03-13 MED FILL — LEVOTHYROXINE 150 MCG TAB: 150 | 30 days supply | Qty: 30 | Fill #0

## 2017-03-13 MED FILL — BUPROPION HCL XL 300 MG TAB: 300 | 90 days supply | Qty: 90 | Fill #0

## 2017-03-16 DIAGNOSIS — Z008 Encounter for other general examination: Secondary | ICD-10-CM | POA: Diagnosis not present

## 2017-03-16 DIAGNOSIS — F329 Major depressive disorder, single episode, unspecified: Secondary | ICD-10-CM | POA: Diagnosis not present

## 2017-03-23 DIAGNOSIS — Z008 Encounter for other general examination: Secondary | ICD-10-CM | POA: Diagnosis not present

## 2017-03-23 DIAGNOSIS — F329 Major depressive disorder, single episode, unspecified: Secondary | ICD-10-CM | POA: Diagnosis not present

## 2017-03-27 DIAGNOSIS — Z008 Encounter for other general examination: Secondary | ICD-10-CM | POA: Diagnosis not present

## 2017-03-27 DIAGNOSIS — F329 Major depressive disorder, single episode, unspecified: Secondary | ICD-10-CM | POA: Diagnosis not present

## 2017-04-10 ENCOUNTER — Ambulatory Visit: Payer: 59 | Admitting: Psychology

## 2017-04-10 DIAGNOSIS — Z008 Encounter for other general examination: Secondary | ICD-10-CM | POA: Diagnosis not present

## 2017-04-10 DIAGNOSIS — F329 Major depressive disorder, single episode, unspecified: Secondary | ICD-10-CM | POA: Diagnosis not present

## 2017-04-11 ENCOUNTER — Encounter: Payer: Self-pay | Admitting: Physician Assistant

## 2017-04-11 ENCOUNTER — Other Ambulatory Visit: Payer: Self-pay | Admitting: Physician Assistant

## 2017-04-11 ENCOUNTER — Ambulatory Visit (INDEPENDENT_AMBULATORY_CARE_PROVIDER_SITE_OTHER): Payer: 59 | Admitting: Physician Assistant

## 2017-04-11 VITALS — BP 137/88 | HR 89 | Ht 67.0 in | Wt 187.0 lb

## 2017-04-11 DIAGNOSIS — F422 Mixed obsessional thoughts and acts: Secondary | ICD-10-CM | POA: Diagnosis not present

## 2017-04-11 DIAGNOSIS — F900 Attention-deficit hyperactivity disorder, predominantly inattentive type: Secondary | ICD-10-CM | POA: Diagnosis not present

## 2017-04-11 DIAGNOSIS — F329 Major depressive disorder, single episode, unspecified: Secondary | ICD-10-CM | POA: Diagnosis not present

## 2017-04-11 DIAGNOSIS — E038 Other specified hypothyroidism: Secondary | ICD-10-CM | POA: Diagnosis not present

## 2017-04-11 DIAGNOSIS — F419 Anxiety disorder, unspecified: Secondary | ICD-10-CM | POA: Diagnosis not present

## 2017-04-11 LAB — TSH: TSH: 0.02 m[IU]/L — AB

## 2017-04-11 MED ORDER — LISDEXAMFETAMINE DIMESYLATE 50 MG PO CAPS: 50.0000 mg | ORAL_CAPSULE | Freq: Every day | ORAL | 0 refills | Status: DC

## 2017-04-11 MED ORDER — BUPROPION HCL ER (XL) 300 MG PO TB24: 300.0000 mg | ORAL_TABLET | Freq: Every day | ORAL | 1 refills | Status: DC

## 2017-04-11 MED ORDER — ARIPIPRAZOLE 2 MG PO TABS: 2.0000 mg | ORAL_TABLET | Freq: Every day | ORAL | 1 refills | Status: DC

## 2017-04-11 MED ORDER — ESCITALOPRAM OXALATE 20 MG PO TABS: 20.0000 mg | ORAL_TABLET | Freq: Every day | ORAL | 1 refills | Status: DC

## 2017-04-12 LAB — T4, FREE: Free T4: 1.4 ng/dL (ref 0.8–1.8)

## 2017-04-12 NOTE — Progress Notes (Signed)
Subjective:    Patient ID: Michelle Browning, female    DOB: 1971/04/09, 46 y.o.   MRN: [id number]  HPI Pt is a 46 yo female who presents to the clinic for 3 month ADHD follow up. She is doing well with her medication. She denies any palpitations, insomnia. She is doing well at work.   Hypothyroidism- needs medication refills. No concerns or complaints.   She is having some woresening depression/anxiety. She recently had a close friend abandon her and brought up past feelings of abandonment. She is going to counseling weekly and helping a lot. She just feels like she needs medication adjustment to get through this time.   .. Active Ambulatory Problems    Diagnosis Date Noted  . Thyroid cancer ([place]) 02/07/2012  . Anxiety and depression 02/07/2012  . Fatigue 02/07/2012  . Hypothyroidism 02/20/2013  . Hyperlipidemia 05/26/2014  . Decreased thyroid stimulating hormone level 06/11/2014  . Overweight (BMI 25.0-29.9) 03/29/2015  . Abnormal weight gain 12/25/2015  . Obese 12/25/2015  . Inattention 12/25/2015  . OCD (obsessive compulsive disorder) 01/27/2016  . Attention deficit hyperactivity disorder (ADHD) 01/27/2016   Resolved Ambulatory Problems    Diagnosis Date Noted  . No Resolved Ambulatory Problems   Past Medical History:  Diagnosis Date  . Depression   . Thyroid disease       Review of Systems  All other systems reviewed and are negative.      Objective:   Physical Exam  Constitutional: She is oriented to person, place, and time. She appears well-developed and well-nourished.  HENT:  Head: Normocephalic and atraumatic.  Cardiovascular: Normal rate, regular rhythm and normal heart sounds.   Pulmonary/Chest: Effort normal and breath sounds normal.  Neurological: She is alert and oriented to person, place, and time.  Psychiatric: She has a normal mood and affect. Her behavior is normal.          Assessment & Plan:  Marland KitchenMarland KitchenDiagnoses and all orders for this  visit:  Attention deficit hyperactivity disorder (ADHD), predominantly inattentive type -     Discontinue: lisdexamfetamine (VYVANSE) 50 MG capsule; Take 1 capsule (50 mg total) by mouth daily. -     Discontinue: lisdexamfetamine (VYVANSE) 50 MG capsule; Take 1 capsule (50 mg total) by mouth daily. -     lisdexamfetamine (VYVANSE) 50 MG capsule; Take 1 capsule (50 mg total) by mouth daily.  Mixed obsessional thoughts and acts -     ARIPiprazole (ABILIFY) 2 MG tablet; Take 1 tablet (2 mg total) by mouth daily. -     buPROPion (WELLBUTRIN XL) 300 MG 24 hr tablet; Take 1 tablet (300 mg total) by mouth daily. -     escitalopram (LEXAPRO) 20 MG tablet; Take 1 tablet (20 mg total) by mouth daily.  Other specified hypothyroidism -     TSH  Anxiety and depression -     ARIPiprazole (ABILIFY) 2 MG tablet; Take 1 tablet (2 mg total) by mouth daily. -     buPROPion (WELLBUTRIN XL) 300 MG 24 hr tablet; Take 1 tablet (300 mg total) by mouth daily. -     escitalopram (LEXAPRO) 20 MG tablet; Take 1 tablet (20 mg total) by mouth daily.   .. Depression screen [facility] 2/9 04/11/2017  Decreased Interest 3  Down, Depressed, Hopeless 2  PHQ - 2 Score 5  Altered sleeping 3  Tired, decreased energy 2  Change in appetite 1  Feeling bad or failure about yourself  0  Trouble concentrating 0  Moving slowly or fidgety/restless 0  Suicidal thoughts 0  PHQ-9 Score 11   .Marland Kitchen GAD 7 : Generalized Anxiety Score 04/11/2017  Nervous, Anxious, on Edge 0  Control/stop worrying 0  Worry too much - different things 1  Trouble relaxing 1  Restless 0  Easily annoyed or irritable 3  Afraid - awful might happen 0  Total GAD 7 Score 5    Added abilify. Discussed side effects follow up in 4-6 weeks.   Spent 30 minutes with patient and greater than 50 percent of visit spent counseling patent regarding treatment plan.

## 2017-04-13 ENCOUNTER — Encounter: Payer: Self-pay | Admitting: *Deleted

## 2017-04-13 ENCOUNTER — Other Ambulatory Visit: Payer: Self-pay | Admitting: *Deleted

## 2017-04-13 DIAGNOSIS — Z008 Encounter for other general examination: Secondary | ICD-10-CM | POA: Diagnosis not present

## 2017-04-13 DIAGNOSIS — F329 Major depressive disorder, single episode, unspecified: Secondary | ICD-10-CM | POA: Diagnosis not present

## 2017-04-13 MED ORDER — LEVOTHYROXINE SODIUM 150 MCG PO TABS: 150.0000 ug | ORAL_TABLET | Freq: Every day | ORAL | 3 refills | Status: DC

## 2017-04-17 MED FILL — LEVOTHYROXINE 150 MCG TAB: 150 | 90 days supply | Qty: 90 | Fill #0

## 2017-04-17 MED FILL — ARIPiprazole 2 MG TABS: 2 | 30 days supply | Qty: 30 | Fill #0

## 2017-04-24 DIAGNOSIS — Z008 Encounter for other general examination: Secondary | ICD-10-CM | POA: Diagnosis not present

## 2017-04-24 DIAGNOSIS — F329 Major depressive disorder, single episode, unspecified: Secondary | ICD-10-CM | POA: Diagnosis not present

## 2017-05-04 DIAGNOSIS — Z008 Encounter for other general examination: Secondary | ICD-10-CM | POA: Diagnosis not present

## 2017-05-04 DIAGNOSIS — F329 Major depressive disorder, single episode, unspecified: Secondary | ICD-10-CM | POA: Diagnosis not present

## 2017-05-15 DIAGNOSIS — Z008 Encounter for other general examination: Secondary | ICD-10-CM | POA: Diagnosis not present

## 2017-05-15 DIAGNOSIS — F329 Major depressive disorder, single episode, unspecified: Secondary | ICD-10-CM | POA: Diagnosis not present

## 2017-05-22 ENCOUNTER — Ambulatory Visit: Payer: Self-pay | Admitting: Physician Assistant

## 2017-05-22 DIAGNOSIS — F329 Major depressive disorder, single episode, unspecified: Secondary | ICD-10-CM | POA: Diagnosis not present

## 2017-05-22 DIAGNOSIS — Z008 Encounter for other general examination: Secondary | ICD-10-CM | POA: Diagnosis not present

## 2017-05-22 MED FILL — ARIPiprazole 2 MG TABS: 2 | 30 days supply | Qty: 30 | Fill #1

## 2017-05-29 DIAGNOSIS — F329 Major depressive disorder, single episode, unspecified: Secondary | ICD-10-CM | POA: Diagnosis not present

## 2017-05-29 DIAGNOSIS — Z008 Encounter for other general examination: Secondary | ICD-10-CM | POA: Diagnosis not present

## 2017-06-05 DIAGNOSIS — F329 Major depressive disorder, single episode, unspecified: Secondary | ICD-10-CM | POA: Diagnosis not present

## 2017-06-05 DIAGNOSIS — Z008 Encounter for other general examination: Secondary | ICD-10-CM | POA: Diagnosis not present

## 2017-06-06 MED FILL — VYVANSE 50 MG CAPSULE: 50 | 30 days supply | Qty: 30 | Fill #0

## 2017-06-15 DIAGNOSIS — F329 Major depressive disorder, single episode, unspecified: Secondary | ICD-10-CM | POA: Diagnosis not present

## 2017-06-15 DIAGNOSIS — Z008 Encounter for other general examination: Secondary | ICD-10-CM | POA: Diagnosis not present

## 2017-06-19 DIAGNOSIS — F329 Major depressive disorder, single episode, unspecified: Secondary | ICD-10-CM | POA: Diagnosis not present

## 2017-06-19 DIAGNOSIS — Z008 Encounter for other general examination: Secondary | ICD-10-CM | POA: Diagnosis not present

## 2017-06-26 ENCOUNTER — Other Ambulatory Visit: Payer: Self-pay | Admitting: Physician Assistant

## 2017-06-26 DIAGNOSIS — F32A Depression, unspecified: Secondary | ICD-10-CM

## 2017-06-26 DIAGNOSIS — F419 Anxiety disorder, unspecified: Secondary | ICD-10-CM

## 2017-06-26 DIAGNOSIS — F329 Major depressive disorder, single episode, unspecified: Secondary | ICD-10-CM | POA: Diagnosis not present

## 2017-06-26 DIAGNOSIS — F422 Mixed obsessional thoughts and acts: Secondary | ICD-10-CM

## 2017-06-26 MED FILL — ESCITALOPRAM 20 MG TABLET: 20 | 90 days supply | Qty: 90 | Fill #1

## 2017-06-26 MED FILL — buPROPion HCL ER (XL) 300 M: 300 | 90 days supply | Qty: 90 | Fill #1

## 2017-07-14 MED FILL — VYVANSE 50 MG CAPSULE: 50 | 30 days supply | Qty: 30 | Fill #0

## 2017-07-14 MED FILL — ARIPiprazole 2 MG TABS: 2 | 30 days supply | Qty: 30 | Fill #0

## 2017-07-25 ENCOUNTER — Ambulatory Visit: Payer: Self-pay | Admitting: Physician Assistant

## 2017-07-27 MED FILL — LEVOTHYROXINE 150 MCG TAB: 150 | 90 days supply | Qty: 90 | Fill #1

## 2017-08-04 ENCOUNTER — Encounter: Payer: Self-pay | Admitting: Physician Assistant

## 2017-08-04 ENCOUNTER — Ambulatory Visit (INDEPENDENT_AMBULATORY_CARE_PROVIDER_SITE_OTHER): Payer: 59 | Admitting: Physician Assistant

## 2017-08-04 DIAGNOSIS — Z23 Encounter for immunization: Secondary | ICD-10-CM

## 2017-08-04 DIAGNOSIS — F419 Anxiety disorder, unspecified: Secondary | ICD-10-CM | POA: Diagnosis not present

## 2017-08-04 DIAGNOSIS — F329 Major depressive disorder, single episode, unspecified: Secondary | ICD-10-CM | POA: Diagnosis not present

## 2017-08-04 DIAGNOSIS — F422 Mixed obsessional thoughts and acts: Secondary | ICD-10-CM | POA: Diagnosis not present

## 2017-08-04 DIAGNOSIS — F900 Attention-deficit hyperactivity disorder, predominantly inattentive type: Secondary | ICD-10-CM

## 2017-08-04 MED ORDER — LISDEXAMFETAMINE DIMESYLATE 50 MG PO CAPS: 50.0000 mg | ORAL_CAPSULE | Freq: Every day | ORAL | 0 refills | Status: DC

## 2017-08-04 MED ORDER — ARIPIPRAZOLE 2 MG PO TABS: 2.0000 mg | ORAL_TABLET | Freq: Every day | ORAL | 1 refills | Status: DC

## 2017-08-04 MED ORDER — ESCITALOPRAM OXALATE 20 MG PO TABS: 20.0000 mg | ORAL_TABLET | Freq: Every day | ORAL | 1 refills | Status: DC

## 2017-08-04 MED ORDER — BUPROPION HCL ER (XL) 300 MG PO TB24: 300.0000 mg | ORAL_TABLET | Freq: Every day | ORAL | 1 refills | Status: DC

## 2017-08-06 NOTE — Progress Notes (Signed)
Subjective:    Patient ID: Michelle Browning, female    DOB: September 25, 1971, 47 y.o.   MRN: [id number]  HPI Pt is a 46 yo female who presents to the clinic for medication follow up.   ADHD-doing great on vyvanse. No concerns. Denies any palpitations, headaches, insomnia. Work is going well.   Anxiety/depression/obessional thoughts and acts- doing much better with addition of abilify. She has little to no anxiety or depression. She continues to go to counseling.   .. Active Ambulatory Problems    Diagnosis Date Noted  . Thyroid cancer ([place]) 02/07/2012  . Anxiety and depression 02/07/2012  . Fatigue 02/07/2012  . Hypothyroidism 02/20/2013  . Hyperlipidemia 05/26/2014  . Decreased thyroid stimulating hormone level 06/11/2014  . Overweight (BMI 25.0-29.9) 03/29/2015  . Abnormal weight gain 12/25/2015  . Obese 12/25/2015  . Inattention 12/25/2015  . OCD (obsessive compulsive disorder) 01/27/2016  . Attention deficit hyperactivity disorder (ADHD) 01/27/2016   Resolved Ambulatory Problems    Diagnosis Date Noted  . No Resolved Ambulatory Problems   Past Medical History:  Diagnosis Date  . Depression   . Thyroid disease       Review of Systems  All other systems reviewed and are negative.      Objective:   Physical Exam  Constitutional: She is oriented to person, place, and time. She appears well-developed and well-nourished.  HENT:  Head: Normocephalic and atraumatic.  Cardiovascular: Normal rate, regular rhythm and normal heart sounds.   Pulmonary/Chest: Effort normal and breath sounds normal.  Neurological: She is alert and oriented to person, place, and time.  Psychiatric: She has a normal mood and affect. Her behavior is normal.          Assessment & Plan:  .[name] was seen today for f/u meds.  Diagnoses and all orders for this visit:  Mixed obsessional thoughts and acts -     ARIPiprazole (ABILIFY) 2 MG tablet; Take 1 tablet (2 mg total) by mouth  daily. -     buPROPion (WELLBUTRIN XL) 300 MG 24 hr tablet; Take 1 tablet (300 mg total) by mouth daily. -     escitalopram (LEXAPRO) 20 MG tablet; Take 1 tablet (20 mg total) by mouth daily.  Anxiety and depression -     ARIPiprazole (ABILIFY) 2 MG tablet; Take 1 tablet (2 mg total) by mouth daily. -     buPROPion (WELLBUTRIN XL) 300 MG 24 hr tablet; Take 1 tablet (300 mg total) by mouth daily. -     escitalopram (LEXAPRO) 20 MG tablet; Take 1 tablet (20 mg total) by mouth daily.  Attention deficit hyperactivity disorder (ADHD), predominantly inattentive type -     Discontinue: lisdexamfetamine (VYVANSE) 50 MG capsule; Take 1 capsule (50 mg total) by mouth daily. -     Discontinue: lisdexamfetamine (VYVANSE) 50 MG capsule; Take 1 capsule (50 mg total) by mouth daily. -     lisdexamfetamine (VYVANSE) 50 MG capsule; Take 1 capsule (50 mg total) by mouth daily.  Need for immunization against influenza -     Flu Vaccine QUAD 36+ mos IM   .Marland Kitchen Depression screen [facility] 2/9 08/04/2017 08/04/2017 04/11/2017  Decreased Interest 0 0 3  Down, Depressed, Hopeless 0 0 2  PHQ - 2 Score 0 0 5  Altered sleeping 2 - 3  Tired, decreased energy 0 - 2  Change in appetite 0 - 1  Feeling bad or failure about yourself  0 - 0  Trouble concentrating 0 - 0  Moving slowly or fidgety/restless 0 - 0  Suicidal thoughts 0 - 0  PHQ-9 Score 2 - 11  Difficult doing work/chores Not difficult at all - -   .Marland Kitchen GAD 7 : Generalized Anxiety Score 08/04/2017 04/11/2017  Nervous, Anxious, on Edge 0 0  Control/stop worrying 0 0  Worry too much - different things 0 1  Trouble relaxing 0 1  Restless 0 0  Easily annoyed or irritable 0 3  Afraid - awful might happen 0 0  Total GAD 7 Score 0 5  Anxiety Difficulty Not difficult at all -    Refilled medications.  Follow up in 3 months.

## 2017-08-28 DIAGNOSIS — F329 Major depressive disorder, single episode, unspecified: Secondary | ICD-10-CM | POA: Diagnosis not present

## 2017-08-29 MED FILL — ARIPiprazole 2 MG TABS: 2 | 90 days supply | Qty: 90 | Fill #0

## 2017-08-29 MED FILL — VYVANSE 50 MG CAPSULE: 50 | 30 days supply | Qty: 30 | Fill #0

## 2017-09-11 ENCOUNTER — Encounter: Payer: Self-pay | Admitting: Physician Assistant

## 2017-09-11 DIAGNOSIS — F329 Major depressive disorder, single episode, unspecified: Secondary | ICD-10-CM | POA: Diagnosis not present

## 2017-09-11 DIAGNOSIS — F330 Major depressive disorder, recurrent, mild: Secondary | ICD-10-CM | POA: Insufficient documentation

## 2017-09-25 DIAGNOSIS — F329 Major depressive disorder, single episode, unspecified: Secondary | ICD-10-CM | POA: Diagnosis not present

## 2017-09-27 MED FILL — ESCITALOPRAM 20 MG TABLET: 20 | 90 days supply | Qty: 90 | Fill #0

## 2017-09-27 MED FILL — BUPROPION HCL XL 300 MG TAB: 300 | 90 days supply | Qty: 90 | Fill #0

## 2017-09-27 MED FILL — VYVANSE 50 MG CAPSULE: 50 | 30 days supply | Qty: 30 | Fill #0

## 2017-10-12 DIAGNOSIS — F329 Major depressive disorder, single episode, unspecified: Secondary | ICD-10-CM | POA: Diagnosis not present

## 2017-11-15 MED FILL — VYVANSE 50 MG CAPSULE: 50 | 30 days supply | Qty: 30 | Fill #0

## 2017-12-15 ENCOUNTER — Telehealth: Payer: Self-pay | Admitting: Physician Assistant

## 2017-12-15 DIAGNOSIS — F900 Attention-deficit hyperactivity disorder, predominantly inattentive type: Secondary | ICD-10-CM

## 2017-12-15 MED ORDER — LISDEXAMFETAMINE DIMESYLATE 50 MG PO CAPS: 50.0000 mg | ORAL_CAPSULE | Freq: Every day | ORAL | 0 refills | Status: DC

## 2017-12-15 MED FILL — VYVANSE 50 MG CAPSULE: 50 | 30 days supply | Qty: 30 | Fill #0

## 2017-12-15 NOTE — Telephone Encounter (Signed)
sent 

## 2017-12-15 NOTE — Telephone Encounter (Signed)
Sent refill. Follow up.

## 2017-12-18 MED FILL — LEVOTHYROXINE 150 MCG TAB: 150 | 90 days supply | Qty: 90 | Fill #2

## 2017-12-18 MED FILL — ARIPiprazole 2 MG TABS: 2 | 90 days supply | Qty: 90 | Fill #1

## 2017-12-20 ENCOUNTER — Ambulatory Visit (INDEPENDENT_AMBULATORY_CARE_PROVIDER_SITE_OTHER): Payer: No Typology Code available for payment source | Admitting: Physician Assistant

## 2017-12-20 ENCOUNTER — Encounter: Payer: Self-pay | Admitting: Physician Assistant

## 2017-12-20 VITALS — BP 107/76 | HR 76 | Ht 67.0 in | Wt 185.0 lb

## 2017-12-20 DIAGNOSIS — F900 Attention-deficit hyperactivity disorder, predominantly inattentive type: Secondary | ICD-10-CM | POA: Diagnosis not present

## 2017-12-20 DIAGNOSIS — F329 Major depressive disorder, single episode, unspecified: Secondary | ICD-10-CM

## 2017-12-20 DIAGNOSIS — F422 Mixed obsessional thoughts and acts: Secondary | ICD-10-CM

## 2017-12-20 DIAGNOSIS — J209 Acute bronchitis, unspecified: Secondary | ICD-10-CM | POA: Diagnosis not present

## 2017-12-20 DIAGNOSIS — F419 Anxiety disorder, unspecified: Secondary | ICD-10-CM

## 2017-12-20 MED ORDER — ESCITALOPRAM OXALATE 20 MG PO TABS: 20.0000 mg | ORAL_TABLET | Freq: Every day | ORAL | 1 refills | Status: DC

## 2017-12-20 MED ORDER — LISDEXAMFETAMINE DIMESYLATE 50 MG PO CAPS: 50.0000 mg | ORAL_CAPSULE | Freq: Every day | ORAL | 0 refills | Status: DC

## 2017-12-20 MED ORDER — BUPROPION HCL ER (XL) 300 MG PO TB24: 300.0000 mg | ORAL_TABLET | Freq: Every day | ORAL | 1 refills | Status: DC

## 2017-12-20 MED ORDER — ARIPIPRAZOLE 2 MG PO TABS: 2.0000 mg | ORAL_TABLET | Freq: Every day | ORAL | 1 refills | Status: DC

## 2017-12-20 NOTE — Progress Notes (Signed)
Subjective:    Patient ID: Michelle Browning, female    DOB: 1971-10-28, 47 y.o.   MRN: [id number]  HPI Pt is a 47 yo female who presents to the clinic for 3 month follow up on medications.   ADHD- doing great. No problems or concerns. Denies any anxiety, insomnia, palpitations. Doing well at work.   Anxiety/depression/obessional thoughts- addition of abilify is doing great with wellbutrin/lexapro. Needs refills.   She has had a cough for about 1 week. No headache, sinus pressure, ear pain, ST. She has not tried anything to make better. Woke up this morning with eye watering. No fever, chills, body aches. Seems to feel a little better today. No wheezing or SOB. No hx of asthma. She does not smoke. Her husband is also sick with similar symptoms.   .. Active Ambulatory Problems    Diagnosis Date Noted  . Thyroid cancer ([place]) 02/07/2012  . Anxiety and depression 02/07/2012  . Fatigue 02/07/2012  . Hypothyroidism 02/20/2013  . Hyperlipidemia 05/26/2014  . Decreased thyroid stimulating hormone level 06/11/2014  . Overweight (BMI 25.0-29.9) 03/29/2015  . Abnormal weight gain 12/25/2015  . Obese 12/25/2015  . Inattention 12/25/2015  . OCD (obsessive compulsive disorder) 01/27/2016  . Attention deficit hyperactivity disorder (ADHD) 01/27/2016  . 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 No Resolved Ambulatory Problems   Past Medical History:  Diagnosis Date  . Depression   . Thyroid disease       Review of Systems  All other systems reviewed and are negative.      Objective:   Physical Exam  Constitutional: She is oriented to person, place, and time. She appears well-developed and well-nourished.  HENT:  Head: Normocephalic and atraumatic.  Right Ear: External ear normal.  Left Ear: External ear normal.  Nose: Nose normal.  Mouth/Throat: Oropharynx is clear and moist. No  oropharyngeal exudate.  Eyes: Conjunctivae are normal.  Neck: Normal range of motion. Neck supple.  Cardiovascular: Normal rate, regular rhythm and normal heart sounds.  Pulmonary/Chest: Effort normal and breath sounds normal. She has no wheezes.  Rhonchi over left apex of lung cleared wih cough.   Lymphadenopathy:    She has no cervical adenopathy.  Neurological: She is alert and oriented to person, place, and time.  Psychiatric: She has a normal mood and affect. Her behavior is normal.          Assessment & Plan:   .Marland KitchenAlessia was seen today for adhd.  Diagnoses and all orders for this visit:  Attention deficit hyperactivity disorder (ADHD), predominantly inattentive type -     lisdexamfetamine (VYVANSE) 50 MG capsule; Take 1 capsule (50 mg total) by mouth daily.  Mixed obsessional thoughts and acts -     buPROPion (WELLBUTRIN XL) 300 MG 24 hr tablet; Take 1 tablet (300 mg total) by mouth daily. -     escitalopram (LEXAPRO) 20 MG tablet; Take 1 tablet (20 mg total) by mouth daily. -     ARIPiprazole (ABILIFY) 2 MG tablet; Take 1 tablet (2 mg total) by mouth daily.  Anxiety and depression -     buPROPion (WELLBUTRIN XL) 300 MG 24 hr tablet; Take 1 tablet (300 mg total) by mouth daily. -     escitalopram (LEXAPRO) 20 MG tablet; Take 1 tablet (20 mg total) by mouth daily. -     ARIPiprazole (ABILIFY) 2 MG tablet; Take 1 tablet (2  mg total) by mouth daily.  Acute bronchitis, unspecified organism  .Marland Kitchen Depression screen [facility] 2/9 12/20/2017 08/04/2017 08/04/2017 04/11/2017  Decreased Interest 0 0 0 3  Down, Depressed, Hopeless 0 0 0 2  PHQ - 2 Score 0 0 0 5  Altered sleeping 1 2 - 3  Tired, decreased energy 0 0 - 2  Change in appetite 0 0 - 1  Feeling bad or failure about yourself  0 0 - 0  Trouble concentrating 0 0 - 0  Moving slowly or fidgety/restless 0 0 - 0  Suicidal thoughts 0 0 - 0  PHQ-9 Score 1 2 - 11  Difficult doing work/chores Not difficult at all Not difficult at all -  -   .Marland Kitchen GAD 7 : Generalized Anxiety Score 12/20/2017 08/04/2017 04/11/2017  Nervous, Anxious, on Edge 0 0 0  Control/stop worrying 0 0 0  Worry too much - different things 0 0 1  Trouble relaxing 1 0 1  Restless 0 0 0  Easily annoyed or irritable 0 0 3  Afraid - awful might happen 0 0 0  Total GAD 7 Score 1 0 5  Anxiety Difficulty Not difficult at all Not difficult at all -     Refills for 3 months.   Discussed likely viral etiology of bronchitis. Consider symptomatic care with humidifer, mucinex, hydration. If not improving or worsening in next 3-5 days call back and will consider abx. Discussed cough may linger for a few weeks.   Needs cPE please schedule.

## 2017-12-20 NOTE — Patient Instructions (Signed)

## 2017-12-21 DIAGNOSIS — F329 Major depressive disorder, single episode, unspecified: Secondary | ICD-10-CM | POA: Diagnosis not present

## 2017-12-21 DIAGNOSIS — Z008 Encounter for other general examination: Secondary | ICD-10-CM | POA: Diagnosis not present

## 2018-02-02 DIAGNOSIS — F329 Major depressive disorder, single episode, unspecified: Secondary | ICD-10-CM | POA: Diagnosis not present

## 2018-02-02 DIAGNOSIS — Z008 Encounter for other general examination: Secondary | ICD-10-CM | POA: Diagnosis not present

## 2018-02-02 MED FILL — BUPROPION HCL XL 300 MG TAB: 300 | 90 days supply | Qty: 90 | Fill #1

## 2018-02-02 MED FILL — ESCITALOPRAM 20 MG TABLET: 20 | 90 days supply | Qty: 90 | Fill #1

## 2018-02-22 DIAGNOSIS — Z008 Encounter for other general examination: Secondary | ICD-10-CM | POA: Diagnosis not present

## 2018-02-22 DIAGNOSIS — F329 Major depressive disorder, single episode, unspecified: Secondary | ICD-10-CM | POA: Diagnosis not present

## 2018-02-23 MED FILL — VYVANSE 50 MG CAPSULE: 50 | 30 days supply | Qty: 30 | Fill #0

## 2018-03-06 ENCOUNTER — Encounter: Payer: Self-pay | Admitting: Physician Assistant

## 2018-03-06 DIAGNOSIS — Z0189 Encounter for other specified special examinations: Secondary | ICD-10-CM

## 2018-03-08 DIAGNOSIS — Z008 Encounter for other general examination: Secondary | ICD-10-CM | POA: Diagnosis not present

## 2018-03-08 DIAGNOSIS — F329 Major depressive disorder, single episode, unspecified: Secondary | ICD-10-CM | POA: Diagnosis not present

## 2018-03-26 DIAGNOSIS — F329 Major depressive disorder, single episode, unspecified: Secondary | ICD-10-CM | POA: Diagnosis not present

## 2018-03-26 DIAGNOSIS — Z008 Encounter for other general examination: Secondary | ICD-10-CM | POA: Diagnosis not present

## 2018-04-16 ENCOUNTER — Other Ambulatory Visit: Payer: Self-pay | Admitting: Physician Assistant

## 2018-04-16 DIAGNOSIS — F900 Attention-deficit hyperactivity disorder, predominantly inattentive type: Secondary | ICD-10-CM

## 2018-04-16 MED FILL — ARIPiprazole 2 MG TABS: 2 | 90 days supply | Qty: 90 | Fill #0

## 2018-04-18 ENCOUNTER — Encounter: Payer: Self-pay | Admitting: Physician Assistant

## 2018-04-18 ENCOUNTER — Ambulatory Visit (INDEPENDENT_AMBULATORY_CARE_PROVIDER_SITE_OTHER): Payer: No Typology Code available for payment source | Admitting: Physician Assistant

## 2018-04-18 VITALS — BP 122/77 | HR 94 | Ht 67.0 in | Wt 193.0 lb

## 2018-04-18 DIAGNOSIS — Z13220 Encounter for screening for lipoid disorders: Secondary | ICD-10-CM

## 2018-04-18 DIAGNOSIS — F32A Depression, unspecified: Secondary | ICD-10-CM

## 2018-04-18 DIAGNOSIS — E039 Hypothyroidism, unspecified: Secondary | ICD-10-CM

## 2018-04-18 DIAGNOSIS — R5383 Other fatigue: Secondary | ICD-10-CM | POA: Diagnosis not present

## 2018-04-18 DIAGNOSIS — F422 Mixed obsessional thoughts and acts: Secondary | ICD-10-CM

## 2018-04-18 DIAGNOSIS — F900 Attention-deficit hyperactivity disorder, predominantly inattentive type: Secondary | ICD-10-CM

## 2018-04-18 DIAGNOSIS — F329 Major depressive disorder, single episode, unspecified: Secondary | ICD-10-CM

## 2018-04-18 DIAGNOSIS — F419 Anxiety disorder, unspecified: Secondary | ICD-10-CM

## 2018-04-18 DIAGNOSIS — Z131 Encounter for screening for diabetes mellitus: Secondary | ICD-10-CM

## 2018-04-18 MED ORDER — BUPROPION HCL ER (XL) 300 MG PO TB24: 300.0000 mg | ORAL_TABLET | Freq: Every day | ORAL | 3 refills | Status: DC

## 2018-04-18 MED ORDER — ARIPIPRAZOLE 2 MG PO TABS: 2.0000 mg | ORAL_TABLET | Freq: Every day | ORAL | 3 refills | Status: DC

## 2018-04-18 MED ORDER — LISDEXAMFETAMINE DIMESYLATE 50 MG PO CAPS: 50.0000 mg | ORAL_CAPSULE | Freq: Every day | ORAL | 0 refills | Status: DC

## 2018-04-18 MED ORDER — LEVOTHYROXINE SODIUM 150 MCG PO TABS: 150.0000 ug | ORAL_TABLET | Freq: Every day | ORAL | 3 refills | Status: DC

## 2018-04-18 MED ORDER — ESCITALOPRAM OXALATE 20 MG PO TABS: 20.0000 mg | ORAL_TABLET | Freq: Every day | ORAL | 3 refills | Status: DC

## 2018-04-18 MED FILL — LEVOTHYROXINE 150 MCG TAB: 150 | 90 days supply | Qty: 90 | Fill #0

## 2018-04-18 MED FILL — VYVANSE 50 MG CAPSULE: 50 | 30 days supply | Qty: 30 | Fill #0

## 2018-04-18 NOTE — Progress Notes (Signed)
Subjective:    Patient ID: Michelle Browning, female    DOB: 04-08-1971, 47 y.o.   MRN: [id number]  HPI Patient is a 47 year old female who presents to the clinic for 14-month follow-up on ADD.  She is doing well on Vyvanse.  She is sleeping well.  She has no concerns with anxiety.  She is able to get her work done and tolerating very well.  Anxiety/depression/obsessional thoughts-patient symptoms are well controlled on Lexapro, Wellbutrin, Abilify.  She has no concerns.  She denies any suicidal thoughts or depression.  Hypothyroidism-she has been on levothyroxine daily.  She ran out about a week ago in our office would not refill any of the medication.  She has felt a tremendous difference in how she feels being off the medication for a week.  Overall she continues to have low energy and concerned with her weight.  She agrees she is not dieting and she is not exercising.  .. Active Ambulatory Problems    Diagnosis Date Noted  . Thyroid cancer ([place]) 02/07/2012  . Anxiety and depression 02/07/2012  . Fatigue 02/07/2012  . Hypothyroidism 02/20/2013  . Hyperlipidemia 05/26/2014  . Decreased thyroid stimulating hormone level 06/11/2014  . Overweight (BMI 25.0-29.9) 03/29/2015  . Abnormal weight gain 12/25/2015  . Obese 12/25/2015  . Inattention 12/25/2015  . OCD (obsessive compulsive disorder) 01/27/2016  . Attention deficit hyperactivity disorder (ADHD) 01/27/2016  . 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 No Resolved Ambulatory Problems   Past Medical History:  Diagnosis Date  . Depression   . Thyroid disease       Review of Systems  All other systems reviewed and are negative.      Objective:   Physical Exam  Constitutional: She is oriented to person, place, and time. She appears well-developed and well-nourished.  HENT:  Head: Normocephalic and atraumatic.   Cardiovascular: Normal rate and regular rhythm.  Pulmonary/Chest: Effort normal and breath sounds normal.  Neurological: She is alert and oriented to person, place, and time.  Skin: Skin is warm.  Psychiatric: She has a normal mood and affect. Her behavior is normal.          Assessment & Plan:  .Marland KitchenSanyah was seen today for hypothyroidism and adhd.  Diagnoses and all orders for this visit:  Attention deficit hyperactivity disorder (ADHD), predominantly inattentive type -     lisdexamfetamine (VYVANSE) 50 MG capsule; Take 1 capsule (50 mg total) by mouth daily.  Mixed obsessional thoughts and acts -     escitalopram (LEXAPRO) 20 MG tablet; Take 1 tablet (20 mg total) by mouth daily. -     buPROPion (WELLBUTRIN XL) 300 MG 24 hr tablet; Take 1 tablet (300 mg total) by mouth daily. -     ARIPiprazole (ABILIFY) 2 MG tablet; Take 1 tablet (2 mg total) by mouth daily.  Anxiety and depression -     escitalopram (LEXAPRO) 20 MG tablet; Take 1 tablet (20 mg total) by mouth daily. -     buPROPion (WELLBUTRIN XL) 300 MG 24 hr tablet; Take 1 tablet (300 mg total) by mouth daily. -     ARIPiprazole (ABILIFY) 2 MG tablet; Take 1 tablet (2 mg total) by mouth daily.  Screening for diabetes mellitus -     COMPLETE METABOLIC PANEL WITH GFR  Screening for lipid disorders -     Lipid Panel w/reflex Direct LDL  Acquired hypothyroidism -  levothyroxine (SYNTHROID, LEVOTHROID) 150 MCG tablet; Take 1 tablet (150 mcg total) by mouth daily before breakfast. -     TSH  No energy -     Vitamin D 1,25 dihydroxy -     B12   .Marland Kitchen Depression screen [facility] 2/9 12/20/2017 08/04/2017 08/04/2017 04/11/2017  Decreased Interest 0 0 0 3  Down, Depressed, Hopeless 0 0 0 2  PHQ - 2 Score 0 0 0 5  Altered sleeping 1 2 - 3  Tired, decreased energy 0 0 - 2  Change in appetite 0 0 - 1  Feeling bad or failure about yourself  0 0 - 0  Trouble concentrating 0 0 - 0  Moving slowly or fidgety/restless 0 0 - 0   Suicidal thoughts 0 0 - 0  PHQ-9 Score 1 2 - 11  Difficult doing work/chores Not difficult at all Not difficult at all - -   .Marland Kitchen GAD 7 : Generalized Anxiety Score 12/20/2017 08/04/2017 04/11/2017  Nervous, Anxious, on Edge 0 0 0  Control/stop worrying 0 0 0  Worry too much - different things 0 0 1  Trouble relaxing 1 0 1  Restless 0 0 0  Easily annoyed or irritable 0 0 3  Afraid - awful might happen 0 0 0  Total GAD 7 Score 1 0 5  Anxiety Difficulty Not difficult at all Not difficult at all -    Refilled Vyvanse for 3 months.  Anxiety and depression is controlled today however she is really can concerned with her weight.  I am concerned that Abilify could be causing some of her weight gain.  We discussed cutting the Abilify in half and seeing how she is doing with her mood over the next 2 months.  We may consider discontinuing this medication at some point.  Discussed regular exercise and diet changes.  Talked about the intermittent fasting.  She will consider.  Ordered labs to check thyroid we will adjust medication accordingly.  Patient needs a complete physical fasting labs ordered.

## 2018-05-01 ENCOUNTER — Encounter: Payer: Self-pay | Admitting: Physician Assistant

## 2018-05-29 ENCOUNTER — Other Ambulatory Visit: Payer: Self-pay | Admitting: Physician Assistant

## 2018-05-29 DIAGNOSIS — F900 Attention-deficit hyperactivity disorder, predominantly inattentive type: Secondary | ICD-10-CM

## 2018-05-29 MED FILL — VYVANSE 50 MG CAPSULE: 50 | 30 days supply | Qty: 30 | Fill #0

## 2018-05-29 MED FILL — ESCITALOPRAM 20 MG TABLET: 20 | 90 days supply | Qty: 90 | Fill #0

## 2018-05-29 MED FILL — buPROPion HCL ER (XL) 300 M: 300 | 90 days supply | Qty: 90 | Fill #0

## 2018-07-11 ENCOUNTER — Other Ambulatory Visit: Payer: Self-pay

## 2018-07-11 ENCOUNTER — Other Ambulatory Visit: Payer: Self-pay | Admitting: Physician Assistant

## 2018-07-11 DIAGNOSIS — F900 Attention-deficit hyperactivity disorder, predominantly inattentive type: Secondary | ICD-10-CM

## 2018-07-11 NOTE — Telephone Encounter (Signed)
Michelle Browning is scheduled for August 26 th. She would like a refill on Vyvanse. Please advise.

## 2018-07-12 MED ORDER — LISDEXAMFETAMINE DIMESYLATE 50 MG PO CAPS: ORAL_CAPSULE | ORAL | 0 refills | Status: DC

## 2018-07-12 MED FILL — VYVANSE 50 MG CAPSULE: 50 | 30 days supply | Qty: 30 | Fill #0

## 2018-07-12 NOTE — Telephone Encounter (Signed)
Okay to refill but needs to keep that appointment prior to any other refills

## 2018-07-30 ENCOUNTER — Ambulatory Visit: Payer: Self-pay | Admitting: Physician Assistant

## 2018-08-08 ENCOUNTER — Encounter: Payer: Self-pay | Admitting: Physician Assistant

## 2018-08-08 ENCOUNTER — Ambulatory Visit (INDEPENDENT_AMBULATORY_CARE_PROVIDER_SITE_OTHER): Payer: No Typology Code available for payment source | Admitting: Physician Assistant

## 2018-08-08 VITALS — BP 114/85 | HR 72 | Ht 67.0 in | Wt 192.0 lb

## 2018-08-08 DIAGNOSIS — F32A Depression, unspecified: Secondary | ICD-10-CM

## 2018-08-08 DIAGNOSIS — Z1231 Encounter for screening mammogram for malignant neoplasm of breast: Secondary | ICD-10-CM

## 2018-08-08 DIAGNOSIS — Z23 Encounter for immunization: Secondary | ICD-10-CM

## 2018-08-08 DIAGNOSIS — F419 Anxiety disorder, unspecified: Secondary | ICD-10-CM | POA: Diagnosis not present

## 2018-08-08 DIAGNOSIS — F900 Attention-deficit hyperactivity disorder, predominantly inattentive type: Secondary | ICD-10-CM

## 2018-08-08 DIAGNOSIS — F329 Major depressive disorder, single episode, unspecified: Secondary | ICD-10-CM

## 2018-08-08 MED ORDER — LISDEXAMFETAMINE DIMESYLATE 50 MG PO CAPS
50.0000 mg | ORAL_CAPSULE | Freq: Every day | ORAL | 0 refills | Status: DC
Start: 2018-08-08 — End: 2018-11-07

## 2018-08-08 MED ORDER — LISDEXAMFETAMINE DIMESYLATE 50 MG PO CAPS
50.0000 mg | ORAL_CAPSULE | Freq: Every day | ORAL | 0 refills | Status: DC
Start: 2018-09-07 — End: 2018-11-07

## 2018-08-08 MED ORDER — LISDEXAMFETAMINE DIMESYLATE 50 MG PO CAPS: ORAL_CAPSULE | ORAL | 0 refills | Status: DC

## 2018-08-08 NOTE — Progress Notes (Signed)
Subjective:    Patient ID: Michelle Browning, female    DOB: 1971-08-27, 47 y.o.   MRN: [id number]  HPI Pt is a 47 yo female with anxiety, depression, OCD and ADD who presents to the clinic for follow up.   Overall patient is doing great. She has tapered herself off abilify. She has been completely off for about 1 week. She does not notice any mood changes yet. She is taking all the other medications. She is not exercising. Work is going good. Taking vyvanse without any problems. No insomnia, anxiety, palpitations.   .. Active Ambulatory Problems    Diagnosis Date Noted  . Thyroid cancer ([place]) 02/07/2012  . Anxiety and depression 02/07/2012  . Fatigue 02/07/2012  . Hypothyroidism 02/20/2013  . Hyperlipidemia 05/26/2014  . Decreased thyroid stimulating hormone level 06/11/2014  . Overweight (BMI 25.0-29.9) 03/29/2015  . Abnormal weight gain 12/25/2015  . Obese 12/25/2015  . Inattention 12/25/2015  . OCD (obsessive compulsive disorder) 01/27/2016  . Attention deficit hyperactivity disorder (ADHD) 01/27/2016  . 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 No Resolved Ambulatory Problems   Past Medical History:  Diagnosis Date  . Depression   . Thyroid disease      Review of Systems  All other systems reviewed and are negative.      Objective:   Physical Exam  Constitutional: She is oriented to person, place, and time. She appears well-developed and well-nourished.  HENT:  Head: Normocephalic and atraumatic.  Cardiovascular: Normal rate and regular rhythm.  Neurological: She is alert and oriented to person, place, and time.  Psychiatric: She has a normal mood and affect. Her behavior is normal.          Assessment & Plan:  Marland KitchenMarland KitchenDiagnoses and all orders for this visit:  Attention deficit hyperactivity disorder (ADHD), predominantly inattentive type -     lisdexamfetamine  (VYVANSE) 50 MG capsule; TAKE 1 CAPSULE (50 MG TOTAL) BY MOUTH DAILY. -     lisdexamfetamine (VYVANSE) 50 MG capsule; Take 1 capsule (50 mg total) by mouth daily. -     lisdexamfetamine (VYVANSE) 50 MG capsule; Take 1 capsule (50 mg total) by mouth daily.  Need for immunization against influenza -     Flu Vaccine QUAD 36+ mos IM  Visit for screening mammogram -     MM 3D SCREEN BREAST BILATERAL  Anxiety and depression   .Marland Kitchen Depression screen [facility] 2/9 08/08/2018 12/20/2017 08/04/2017 08/04/2017 04/11/2017  Decreased Interest 0 0 0 0 3  Down, Depressed, Hopeless 0 0 0 0 2  PHQ - 2 Score 0 0 0 0 5  Altered sleeping 0 1 2 - 3  Tired, decreased energy 1 0 0 - 2  Change in appetite 0 0 0 - 1  Feeling bad or failure about yourself  0 0 0 - 0  Trouble concentrating 0 0 0 - 0  Moving slowly or fidgety/restless 0 0 0 - 0  Suicidal thoughts 0 0 0 - 0  PHQ-9 Score 1 1 2  - 11  Difficult doing work/chores Not difficult at all Not difficult at all Not difficult at all - -   .Marland Kitchen GAD 7 : Generalized Anxiety Score 08/08/2018 12/20/2017 08/04/2017 04/11/2017  Nervous, Anxious, on Edge 0 0 0 0  Control/stop worrying 0 0 0 0  Worry too much - different things 0 0 0 1  Trouble relaxing 0 1 0  1  Restless 0 0 0 0  Easily annoyed or irritable 0 0 0 3  Afraid - awful might happen 0 0 0 0  Total GAD 7 Score 0 1 0 5  Anxiety Difficulty Not difficult at all Not difficult at all Not difficult at all -   vyvanse refilled for 3 months. Follow up in 3 months.   Continue on other depression/anxiety medications. Ok to be off Abilify for now made aware she could notice more changes to mood when been off longer than 4 weeks. Follow up as needed.

## 2018-08-09 MED FILL — LEVOTHYROXINE 150 MCG TAB: 150 | 90 days supply | Qty: 90 | Fill #1

## 2018-08-09 MED FILL — VYVANSE 50 MG CAPSULE: 50 | 30 days supply | Qty: 30 | Fill #0

## 2018-09-13 MED FILL — ESCITALOPRAM 20 MG TABLET: 20 | 90 days supply | Qty: 90 | Fill #1

## 2018-09-13 MED FILL — BUPROPION HCL ER (XL) 300 M: 300 | 90 days supply | Qty: 90 | Fill #1

## 2018-10-18 MED FILL — VYVANSE 50 MG CAPSULE: 50 | 30 days supply | Qty: 30 | Fill #0

## 2018-10-25 ENCOUNTER — Ambulatory Visit (INDEPENDENT_AMBULATORY_CARE_PROVIDER_SITE_OTHER): Payer: No Typology Code available for payment source

## 2018-10-25 DIAGNOSIS — Z1231 Encounter for screening mammogram for malignant neoplasm of breast: Secondary | ICD-10-CM

## 2018-10-25 NOTE — Progress Notes (Signed)
Call pt: normal mammogram. Follow up in one year.

## 2018-11-07 ENCOUNTER — Encounter: Payer: Self-pay | Admitting: Physician Assistant

## 2018-11-07 ENCOUNTER — Ambulatory Visit (INDEPENDENT_AMBULATORY_CARE_PROVIDER_SITE_OTHER): Payer: No Typology Code available for payment source | Admitting: Physician Assistant

## 2018-11-07 DIAGNOSIS — F900 Attention-deficit hyperactivity disorder, predominantly inattentive type: Secondary | ICD-10-CM

## 2018-11-07 MED ORDER — LISDEXAMFETAMINE DIMESYLATE 50 MG PO CAPS: 50.0000 mg | ORAL_CAPSULE | Freq: Every day | ORAL | 0 refills | Status: DC

## 2018-11-07 MED ORDER — LISDEXAMFETAMINE DIMESYLATE 50 MG PO CAPS: ORAL_CAPSULE | ORAL | 0 refills | Status: DC

## 2018-11-07 NOTE — Progress Notes (Signed)
   Subjective:    Patient ID: Michelle Browning, female    DOB: [birth year], 47 y.o.   MRN: [id number]  HPI Patient is a 47 year old female with ADHD, anxiety and depression who presents to the clinic for her 56-month follow-up on Vyvanse.  Patient is doing great.  She is doing well at work and being able to stay focused.  She denies any side effects from medication.  She denies any increase in anxiety or depression.  At last visit she did stop her Abilify and she has not noticed any difference except for decrease in appetite.  She welcomes the decrease in appetite.  She is down 3 pounds.  She denies any chest pain, palpitations, headaches.  .. Active Ambulatory Problems    Diagnosis Date Noted  . Thyroid cancer ([place]) 02/07/2012  . Anxiety and depression 02/07/2012  . Fatigue 02/07/2012  . Hypothyroidism 02/20/2013  . Hyperlipidemia 05/26/2014  . Decreased thyroid stimulating hormone level 06/11/2014  . Overweight (BMI 25.0-29.9) 03/29/2015  . Abnormal weight gain 12/25/2015  . Obese 12/25/2015  . Inattention 12/25/2015  . OCD (obsessive compulsive disorder) 01/27/2016  . Attention deficit hyperactivity disorder (ADHD) 01/27/2016  . 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 No Resolved Ambulatory Problems   Past Medical History:  Diagnosis Date  . Depression   . Thyroid disease       Review of Systems    see HPI.  Objective:   Physical Exam  Constitutional: She is oriented to person, place, and time. She appears well-developed and well-nourished.  Cardiovascular: Normal rate, regular rhythm and normal heart sounds.  Pulmonary/Chest: Effort normal and breath sounds normal.  Neurological: She is alert and oriented to person, place, and time.  Psychiatric: She has a normal mood and affect. Her behavior is normal.          Assessment & Plan:  .Marland KitchenTionne was seen today for medication  refill.  Diagnoses and all orders for this visit:  Attention deficit hyperactivity disorder (ADHD), predominantly inattentive type -     lisdexamfetamine (VYVANSE) 50 MG capsule; TAKE 1 CAPSULE (50 MG TOTAL) BY MOUTH DAILY. -     lisdexamfetamine (VYVANSE) 50 MG capsule; Take 1 capsule (50 mg total) by mouth daily. -     lisdexamfetamine (VYVANSE) 50 MG capsule; Take 1 capsule (50 mg total) by mouth daily.   Refilled for 3 months. Doing great.

## 2018-12-07 MED FILL — buPROPion HCL ER (XL) 300 M: 300 | 90 days supply | Qty: 90 | Fill #0

## 2018-12-07 MED FILL — ESCITALOPRAM 20 MG TABLET: 20 | 90 days supply | Qty: 90 | Fill #0

## 2018-12-07 MED FILL — VYVANSE 50 MG CAPSULE: 50 | 30 days supply | Qty: 30 | Fill #0

## 2018-12-07 MED FILL — LEVOTHYROXINE 150 MCG TAB: 150 | 90 days supply | Qty: 90 | Fill #2

## 2019-02-06 ENCOUNTER — Ambulatory Visit (INDEPENDENT_AMBULATORY_CARE_PROVIDER_SITE_OTHER): Payer: No Typology Code available for payment source | Admitting: Physician Assistant

## 2019-02-06 ENCOUNTER — Encounter: Payer: Self-pay | Admitting: Physician Assistant

## 2019-02-06 VITALS — BP 111/67 | HR 86 | Temp 98.5°F | Wt 193.0 lb

## 2019-02-06 DIAGNOSIS — F900 Attention-deficit hyperactivity disorder, predominantly inattentive type: Secondary | ICD-10-CM

## 2019-02-06 DIAGNOSIS — E6609 Other obesity due to excess calories: Secondary | ICD-10-CM | POA: Diagnosis not present

## 2019-02-06 DIAGNOSIS — Z6830 Body mass index (BMI) 30.0-30.9, adult: Secondary | ICD-10-CM | POA: Diagnosis not present

## 2019-02-06 DIAGNOSIS — E66811 Obesity, class 1: Secondary | ICD-10-CM

## 2019-02-06 MED ORDER — LISDEXAMFETAMINE DIMESYLATE 50 MG PO CAPS: 50.0000 mg | ORAL_CAPSULE | Freq: Every day | ORAL | 0 refills | Status: DC

## 2019-02-06 MED ORDER — LISDEXAMFETAMINE DIMESYLATE 50 MG PO CAPS: ORAL_CAPSULE | ORAL | 0 refills | Status: DC

## 2019-02-06 NOTE — Progress Notes (Signed)
   Subjective:    Patient ID: Michelle Browning, female    DOB: 02/12/1971, 48 y.o.   MRN: [id number]  HPI  Michelle Browning presents today to follow up on her Vyvanse medication. She has no complaints on it. She feels she is productive and able to focus better. Her mood is stable and she is sleeping well. She has recently gained 4 pounds but she does state she is not taking hr vyvanse daily anymore because some days she is able to work at home. She denies any palpitations or blood pressure changes.   .. Active Ambulatory Problems    Diagnosis Date Noted  . Thyroid cancer ([place]) 02/07/2012  . Anxiety and depression 02/07/2012  . Fatigue 02/07/2012  . Hypothyroidism 02/20/2013  . Hyperlipidemia 05/26/2014  . Decreased thyroid stimulating hormone level 06/11/2014  . Overweight (BMI 25.0-29.9) 03/29/2015  . Abnormal weight gain 12/25/2015  . Class 1 obesity due to excess calories without serious comorbidity with body mass index (BMI) of 30.0 to 30.9 in adult 12/25/2015  . Inattention 12/25/2015  . OCD (obsessive compulsive disorder) 01/27/2016  . Attention deficit hyperactivity disorder (ADHD) 01/27/2016  . 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 No Resolved Ambulatory Problems   Past Medical History:  Diagnosis Date  . Depression   . Thyroid disease       Review of Systems See HPI    Objective:   Physical Exam Constitutional:      Appearance: Normal appearance.  HENT:     Head: Normocephalic.     Nose: Nose normal.     Mouth/Throat:     Pharynx: Oropharynx is clear.  Eyes:     Conjunctiva/sclera: Conjunctivae normal.  Cardiovascular:     Rate and Rhythm: Normal rate and regular rhythm.     Heart sounds: Normal heart sounds.  Pulmonary:     Breath sounds: Normal breath sounds.  Skin:    General: Skin is warm.     Capillary Refill: Capillary refill takes less than 2 seconds.   Neurological:     General: No focal deficit present.     Mental Status: She is alert.  Psychiatric:        Mood and Affect: Mood normal.           Assessment & Plan:  .Marland KitchenElaina was seen today for medication management.  Diagnoses and all orders for this visit:  Attention deficit hyperactivity disorder (ADHD), predominantly inattentive type -     lisdexamfetamine (VYVANSE) 50 MG capsule; Take 1 capsule (50 mg total) by mouth daily. -     lisdexamfetamine (VYVANSE) 50 MG capsule; Take 1 capsule (50 mg total) by mouth daily. -     lisdexamfetamine (VYVANSE) 50 MG capsule; TAKE 1 CAPSULE (50 MG TOTAL) BY MOUTH DAILY.  Class 1 obesity due to excess calories without serious comorbidity with body mass index (BMI) of 30.0 to 30.9 in adult   Refilled vyvanse for 3 months.   Discussed weight loss.  Consider saxenda. Call insurance to check on pricing.  Discussed how to use and mechanism of action.  Encouraged 173min of exercise a week.   Marland KitchenVernetta Honey PA-C, have reviewed and agree with the above documentation in it's entirety.

## 2019-02-06 NOTE — Patient Instructions (Signed)
saxenda injectable for weight loss.

## 2019-03-06 MED FILL — ESCITALOPRAM 20 MG TABLET: 20 | 90 days supply | Qty: 90 | Fill #1

## 2019-03-06 MED FILL — LEVOTHYROXINE 150 MCG TAB: 150 | 90 days supply | Qty: 90 | Fill #3

## 2019-03-06 MED FILL — buPROPion HCL ER (XL) 300 M: 300 | 90 days supply | Qty: 90 | Fill #1

## 2019-03-06 MED FILL — VYVANSE 50 MG CAPSULE: 50 | 30 days supply | Qty: 30 | Fill #0

## 2019-05-10 ENCOUNTER — Ambulatory Visit: Payer: No Typology Code available for payment source | Admitting: Physician Assistant

## 2019-06-20 ENCOUNTER — Other Ambulatory Visit: Payer: Self-pay | Admitting: Physician Assistant

## 2019-06-20 DIAGNOSIS — E039 Hypothyroidism, unspecified: Secondary | ICD-10-CM

## 2019-06-20 MED FILL — VYVANSE 50 MG CAPSULE: 50 | 30 days supply | Qty: 30 | Fill #0

## 2019-06-20 MED FILL — LEVOTHYROXINE 150 MCG TAB: 150 | 30 days supply | Qty: 30 | Fill #0

## 2019-07-01 ENCOUNTER — Other Ambulatory Visit: Payer: Self-pay | Admitting: Physician Assistant

## 2019-07-01 DIAGNOSIS — F419 Anxiety disorder, unspecified: Secondary | ICD-10-CM

## 2019-07-01 DIAGNOSIS — F329 Major depressive disorder, single episode, unspecified: Secondary | ICD-10-CM

## 2019-07-01 DIAGNOSIS — F422 Mixed obsessional thoughts and acts: Secondary | ICD-10-CM

## 2019-07-01 MED FILL — buPROPion HCL ER (XL) 300 M: 300 | 30 days supply | Qty: 30 | Fill #0

## 2019-07-22 ENCOUNTER — Other Ambulatory Visit: Payer: Self-pay | Admitting: Physician Assistant

## 2019-07-22 DIAGNOSIS — F329 Major depressive disorder, single episode, unspecified: Secondary | ICD-10-CM

## 2019-07-22 DIAGNOSIS — F422 Mixed obsessional thoughts and acts: Secondary | ICD-10-CM

## 2019-07-22 DIAGNOSIS — F419 Anxiety disorder, unspecified: Secondary | ICD-10-CM

## 2019-07-22 DIAGNOSIS — E039 Hypothyroidism, unspecified: Secondary | ICD-10-CM

## 2019-07-22 MED FILL — LEVOTHYROXINE 150 MCG TAB: 150 | 30 days supply | Qty: 30 | Fill #0

## 2019-07-25 ENCOUNTER — Other Ambulatory Visit: Payer: Self-pay | Admitting: Neurology

## 2019-07-25 DIAGNOSIS — F422 Mixed obsessional thoughts and acts: Secondary | ICD-10-CM

## 2019-07-25 DIAGNOSIS — F329 Major depressive disorder, single episode, unspecified: Secondary | ICD-10-CM

## 2019-07-25 DIAGNOSIS — F32A Depression, unspecified: Secondary | ICD-10-CM

## 2019-07-25 MED ORDER — ESCITALOPRAM OXALATE 20 MG PO TABS: 20.0000 mg | ORAL_TABLET | Freq: Every day | ORAL | 0 refills | Status: DC

## 2019-07-25 MED FILL — ESCITALOPRAM 20 MG TABLET: 20 | 30 days supply | Qty: 30 | Fill #0

## 2019-07-25 MED FILL — buPROPion HCL ER (XL) 300 M: 300 | 30 days supply | Qty: 30 | Fill #0

## 2019-07-25 NOTE — Telephone Encounter (Signed)
PT left vm requesting RX for Lexapro. She has appt 08/13/2019. #30 sent.

## 2019-08-13 ENCOUNTER — Ambulatory Visit (INDEPENDENT_AMBULATORY_CARE_PROVIDER_SITE_OTHER): Payer: No Typology Code available for payment source

## 2019-08-13 ENCOUNTER — Other Ambulatory Visit: Payer: Self-pay

## 2019-08-13 ENCOUNTER — Telehealth: Payer: Self-pay | Admitting: Neurology

## 2019-08-13 ENCOUNTER — Ambulatory Visit (INDEPENDENT_AMBULATORY_CARE_PROVIDER_SITE_OTHER): Payer: No Typology Code available for payment source | Admitting: Physician Assistant

## 2019-08-13 VITALS — BP 139/89 | HR 92 | Ht 66.5 in | Wt 179.0 lb

## 2019-08-13 DIAGNOSIS — Z131 Encounter for screening for diabetes mellitus: Secondary | ICD-10-CM

## 2019-08-13 DIAGNOSIS — Z0000 Encounter for general adult medical examination without abnormal findings: Secondary | ICD-10-CM | POA: Diagnosis not present

## 2019-08-13 DIAGNOSIS — F32A Depression, unspecified: Secondary | ICD-10-CM

## 2019-08-13 DIAGNOSIS — E039 Hypothyroidism, unspecified: Secondary | ICD-10-CM

## 2019-08-13 DIAGNOSIS — F419 Anxiety disorder, unspecified: Secondary | ICD-10-CM

## 2019-08-13 DIAGNOSIS — F422 Mixed obsessional thoughts and acts: Secondary | ICD-10-CM

## 2019-08-13 DIAGNOSIS — R634 Abnormal weight loss: Secondary | ICD-10-CM | POA: Diagnosis not present

## 2019-08-13 DIAGNOSIS — R059 Cough, unspecified: Secondary | ICD-10-CM

## 2019-08-13 DIAGNOSIS — F329 Major depressive disorder, single episode, unspecified: Secondary | ICD-10-CM

## 2019-08-13 DIAGNOSIS — R05 Cough: Secondary | ICD-10-CM

## 2019-08-13 DIAGNOSIS — Z23 Encounter for immunization: Secondary | ICD-10-CM | POA: Diagnosis not present

## 2019-08-13 DIAGNOSIS — Z13220 Encounter for screening for lipoid disorders: Secondary | ICD-10-CM

## 2019-08-13 DIAGNOSIS — F900 Attention-deficit hyperactivity disorder, predominantly inattentive type: Secondary | ICD-10-CM

## 2019-08-13 DIAGNOSIS — R5383 Other fatigue: Secondary | ICD-10-CM

## 2019-08-13 MED ORDER — ALBUTEROL SULFATE HFA 108 (90 BASE) MCG/ACT IN AERS
2.0000 | INHALATION_SPRAY | Freq: Once | RESPIRATORY_TRACT | Status: AC
  Administered 2019-08-13: 2 via RESPIRATORY_TRACT

## 2019-08-13 MED ORDER — BUPROPION HCL ER (XL) 300 MG PO TB24: 300.0000 mg | ORAL_TABLET | Freq: Every day | ORAL | 1 refills | Status: DC

## 2019-08-13 MED ORDER — LISDEXAMFETAMINE DIMESYLATE 50 MG PO CAPS: ORAL_CAPSULE | ORAL | 0 refills | Status: DC

## 2019-08-13 MED ORDER — LISDEXAMFETAMINE DIMESYLATE 50 MG PO CAPS: 50.0000 mg | ORAL_CAPSULE | Freq: Every day | ORAL | 0 refills | Status: DC

## 2019-08-13 MED ORDER — LEVOTHYROXINE SODIUM 150 MCG PO TABS: 150.0000 ug | ORAL_TABLET | Freq: Every day | ORAL | 1 refills | Status: DC

## 2019-08-13 MED ORDER — ESCITALOPRAM OXALATE 20 MG PO TABS: 20.0000 mg | ORAL_TABLET | Freq: Every day | ORAL | 1 refills | Status: DC

## 2019-08-13 MED FILL — VYVANSE 50 MG CAPSULE: 50 | 30 days supply | Qty: 30 | Fill #0

## 2019-08-13 NOTE — Progress Notes (Deleted)
   Subjective:    Patient ID: TELETHA MCBATH, female    DOB: March 31, 1971, 48 y.o.   MRN: [id number]  HPI Lost 14lbs not been trying. No increased dose of vyvanse. No other medication cahnges.   Hypothyroidism   Climb a flight of stairs and can't breath. Test for covid. Always with exertion  Dry and productive cough. Copd.   No masses.  No night sweats.  Loose stools/cough/GERD heaviess on chest che No melena or hematochezia.  No upper abdomen pain.   Review of Systems     Objective:   Physical Exam        Assessment & Plan:

## 2019-08-13 NOTE — Telephone Encounter (Signed)
Patient made aware.

## 2019-08-13 NOTE — Telephone Encounter (Signed)
Ok we address at appt today. We need to make her aware since there is an concern we will not do a physical but yet a complete work up of symptoms.

## 2019-08-13 NOTE — Telephone Encounter (Signed)
Patient left vm letting us know some things she wants to discuss at her appt. She states that she has had diarrhea, vomiting and SOB. She has tested negative for Covid. I called her back to inquire further. She states symptoms have been off and on for past three months. She tested negative 2 weeks ago. Has also lost 17 lbs so she is concerned. FYI.

## 2019-08-14 MED ORDER — LEVOTHYROXINE SODIUM 137 MCG PO TABS: 137.0000 ug | ORAL_TABLET | Freq: Every day | ORAL | 1 refills | Status: DC

## 2019-08-14 MED FILL — LEVOTHYROXINE 137 MCG TABLE: 137 | 30 days supply | Qty: 30 | Fill #0

## 2019-08-14 NOTE — Progress Notes (Signed)
Normal CXR. No fluid or consolidation.

## 2019-08-14 NOTE — Progress Notes (Signed)
Not anemic but iron stores are low. Iron supplementation can at times make you feel better. I would start OTC iron supplement with breakfast.   Kidney, liver, glucose look good.   b12 is fabulous. Vitamin D still pending.   LDL is 161 and not to optimal goal of under 100. HDL is good at 61 but down from 3 years ago. Overall 10 year CV risk is 1.5 percent and still pretty low. With your family risk factors and the fact that your bad cholesterol remains elevated. Starting a low dose statin would not be a bad idea. Thoughts?   Your TSH is low which is indictative of a hyper thyroid state. Your free levels are in normal range but upper limits of normal. Some of your symptoms are more HYPER thyroid. I think we should decrease your thyroid medication and see if you start to feel a little better. I know I just sent refill. But will send lower dose in to replace. Recheck with lab only in 4-6 weeks.

## 2019-08-16 LAB — T4, FREE: Free T4: 1.8 ng/dL (ref 0.8–1.8)

## 2019-08-16 LAB — CBC WITH DIFFERENTIAL/PLATELET
Absolute Monocytes: 755 cells/uL (ref 200–950)
Basophils Absolute: 64 cells/uL (ref 0–200)
Basophils Relative: 0.7 %
Eosinophils Absolute: 191 cells/uL (ref 15–500)
Eosinophils Relative: 2.1 %
HCT: 41.7 % (ref 35.0–45.0)
Hemoglobin: 13.9 g/dL (ref 11.7–15.5)
Lymphs Abs: 3058 cells/uL (ref 850–3900)
MCH: 27.9 pg (ref 27.0–33.0)
MCHC: 33.3 g/dL (ref 32.0–36.0)
MCV: 83.6 fL (ref 80.0–100.0)
MPV: 9.9 fL (ref 7.5–12.5)
Monocytes Relative: 8.3 %
Neutro Abs: 5032 cells/uL (ref 1500–7800)
Neutrophils Relative %: 55.3 %
Platelets: 292 10*3/uL (ref 140–400)
RBC: 4.99 10*6/uL (ref 3.80–5.10)
RDW: 12.6 % (ref 11.0–15.0)
Total Lymphocyte: 33.6 %
WBC: 9.1 10*3/uL (ref 3.8–10.8)

## 2019-08-16 LAB — IRON,TIBC AND FERRITIN PANEL
%SAT: 24 % (calc) (ref 16–45)
Ferritin: 11 ng/mL — ABNORMAL LOW (ref 16–232)
Iron: 92 ug/dL (ref 40–190)
TIBC: 383 mcg/dL (calc) (ref 250–450)

## 2019-08-16 LAB — B12 AND FOLATE PANEL
Folate: 7.9 ng/mL
Vitamin B-12: 635 pg/mL (ref 200–1100)

## 2019-08-16 LAB — LIPID PANEL W/REFLEX DIRECT LDL
Cholesterol: 250 mg/dL — ABNORMAL HIGH (ref <200)
HDL: 61 mg/dL (ref >=50)
LDL Cholesterol (Calc): 161 mg/dL (calc) — ABNORMAL HIGH
Non-HDL Cholesterol (Calc): 189 mg/dL (calc) — ABNORMAL HIGH (ref <130)
Total CHOL/HDL Ratio: 4.1 (calc) (ref <5.0)
Triglycerides: 146 mg/dL (ref <150)

## 2019-08-16 LAB — COMPLETE METABOLIC PANEL WITH GFR
AG Ratio: 1.4 (calc) (ref 1.0–2.5)
ALT: 14 U/L (ref 6–29)
AST: 16 U/L (ref 10–35)
Albumin: 4.3 g/dL (ref 3.6–5.1)
Alkaline phosphatase (APISO): 65 U/L (ref 31–125)
BUN: 7 mg/dL (ref 7–25)
CO2: 25 mmol/L (ref 20–32)
Calcium: 9.2 mg/dL (ref 8.6–10.2)
Chloride: 105 mmol/L (ref 98–110)
Creat: 0.94 mg/dL (ref 0.50–1.10)
GFR, Est African American: 83 mL/min/{1.73_m2} (ref >=60)
GFR, Est Non African American: 72 mL/min/{1.73_m2} (ref >=60)
Globulin: 3 g/dL (calc) (ref 1.9–3.7)
Glucose, Bld: 98 mg/dL (ref 65–99)
Potassium: 3.8 mmol/L (ref 3.5–5.3)
Sodium: 140 mmol/L (ref 135–146)
Total Bilirubin: 0.5 mg/dL (ref 0.2–1.2)
Total Protein: 7.3 g/dL (ref 6.1–8.1)

## 2019-08-16 LAB — VITAMIN D 1,25 DIHYDROXY
Vitamin D 1, 25 (OH)2 Total: 39 pg/mL (ref 18–72)
Vitamin D2 1, 25 (OH)2: <8 pg/mL
Vitamin D3 1, 25 (OH)2: 39 pg/mL

## 2019-08-16 LAB — T3, FREE: T3, Free: 3.6 pg/mL (ref 2.3–4.2)

## 2019-08-16 LAB — TSH: TSH: 0.01 mIU/L — ABNORMAL LOW

## 2019-08-19 ENCOUNTER — Encounter: Payer: Self-pay | Admitting: Physician Assistant

## 2019-08-19 MED FILL — ESCITALOPRAM 20 MG TABLET: 20 | 90 days supply | Qty: 90 | Fill #0

## 2019-08-19 MED FILL — buPROPion HCL ER (XL) 300 M: 300 | 90 days supply | Qty: 90 | Fill #0

## 2019-08-19 NOTE — Patient Instructions (Signed)
Health Maintenance, Female Adopting a healthy lifestyle and getting preventive care are important in promoting health and wellness. Ask your health care provider about:  The right schedule for you to have regular tests and exams.  Things you can do on your own to prevent diseases and keep yourself healthy. What should I know about diet, weight, and exercise? Eat a healthy diet   Eat a diet that includes plenty of vegetables, fruits, low-fat dairy products, and lean protein.  Do not eat a lot of foods that are high in solid fats, added sugars, or sodium. Maintain a healthy weight Body mass index (BMI) is used to identify weight problems. It estimates body fat based on height and weight. Your health care provider can help determine your BMI and help you achieve or maintain a healthy weight. Get regular exercise Get regular exercise. This is one of the most important things you can do for your health. Most adults should:  Exercise for at least 150 minutes each week. The exercise should increase your heart rate and make you sweat (moderate-intensity exercise).  Do strengthening exercises at least twice a week. This is in addition to the moderate-intensity exercise.  Spend less time sitting. Even light physical activity can be beneficial. Watch cholesterol and blood lipids Have your blood tested for lipids and cholesterol at 48 years of age, then have this test every 5 years. Have your cholesterol levels checked more often if:  Your lipid or cholesterol levels are high.  You are older than 48 years of age.  You are at high risk for heart disease. What should I know about cancer screening? Depending on your health history and family history, you may need to have cancer screening at various ages. This may include screening for:  Breast cancer.  Cervical cancer.  Colorectal cancer.  Skin cancer.  Lung cancer. What should I know about heart disease, diabetes, and high blood  pressure? Blood pressure and heart disease  High blood pressure causes heart disease and increases the risk of stroke. This is more likely to develop in people who have high blood pressure readings, are of African descent, or are overweight.  Have your blood pressure checked: ? Every 3-5 years if you are 18-39 years of age. ? Every year if you are 40 years old or older. Diabetes Have regular diabetes screenings. This checks your fasting blood sugar level. Have the screening done:  Once every three years after age 40 if you are at a normal weight and have a low risk for diabetes.  More often and at a younger age if you are overweight or have a high risk for diabetes. What should I know about preventing infection? Hepatitis B If you have a higher risk for hepatitis B, you should be screened for this virus. Talk with your health care provider to find out if you are at risk for hepatitis B infection. Hepatitis C Testing is recommended for:  Everyone born from 1945 through 1965.  Anyone with known risk factors for hepatitis C. Sexually transmitted infections (STIs)  Get screened for STIs, including gonorrhea and chlamydia, if: ? You are sexually active and are younger than 48 years of age. ? You are older than 48 years of age and your health care provider tells you that you are at risk for this type of infection. ? Your sexual activity has changed since you were last screened, and you are at increased risk for chlamydia or gonorrhea. Ask your health care provider if   you are at risk.  Ask your health care provider about whether you are at high risk for HIV. Your health care provider may recommend a prescription medicine to help prevent HIV infection. If you choose to take medicine to prevent HIV, you should first get tested for HIV. You should then be tested every 3 months for as long as you are taking the medicine. Pregnancy  If you are about to stop having your period (premenopausal) and  you may become pregnant, seek counseling before you get pregnant.  Take 400 to 800 micrograms (mcg) of folic acid every day if you become pregnant.  Ask for birth control (contraception) if you want to prevent pregnancy. Osteoporosis and menopause Osteoporosis is a disease in which the bones lose minerals and strength with aging. This can result in bone fractures. If you are 65 years old or older, or if you are at risk for osteoporosis and fractures, ask your health care provider if you should:  Be screened for bone loss.  Take a calcium or vitamin D supplement to lower your risk of fractures.  Be given hormone replacement therapy (HRT) to treat symptoms of menopause. Follow these instructions at home: Lifestyle  Do not use any products that contain nicotine or tobacco, such as cigarettes, e-cigarettes, and chewing tobacco. If you need help quitting, ask your health care provider.  Do not use street drugs.  Do not share needles.  Ask your health care provider for help if you need support or information about quitting drugs. Alcohol use  Do not drink alcohol if: ? Your health care provider tells you not to drink. ? You are pregnant, may be pregnant, or are planning to become pregnant.  If you drink alcohol: ? Limit how much you use to 0-1 drink a day. ? Limit intake if you are breastfeeding.  Be aware of how much alcohol is in your drink. In the [place] one drink equals one 12 oz bottle of beer (355 mL), one 5 oz glass of wine (148 mL), or one 1 oz glass of hard liquor (44 mL). General instructions  Schedule regular health, dental, and eye exams.  Stay current with your vaccines.  Tell your health care provider if: ? You often feel depressed. ? You have ever been abused or do not feel safe at home. Summary  Adopting a healthy lifestyle and getting preventive care are important in promoting health and wellness.  Follow your health care provider's instructions about healthy  diet, exercising, and getting tested or screened for diseases.  Follow your health care provider's instructions on monitoring your cholesterol and blood pressure. This information is not intended to replace advice given to you by your health care provider. Make sure you discuss any questions you have with your health care provider. Document Released: 06/06/2011 Document Revised: 11/14/2018 Document Reviewed: 11/14/2018 [employer] Patient Education  2020 [employer]  

## 2019-08-19 NOTE — Progress Notes (Signed)
Subjective:     Michelle Browning is a 48 y.o. female and is here for a comprehensive physical exam. The patient reports problems - see below.    Pt just does not feel well. She has lost 14lbs without trying over the past 1-2 months. She denies any medication changes.   Her thyroid has not been checked in a while.   She feels more SOB when being active like climbing a flight of stairs. No CP. Her cough is intermittent and dry to productive. She has a hx of smoking but has not smoked in years. She has some GERD that seems to be worsening and at times chest feels "heavy". No melena or hematochezia. No abdominal pain.   Denies any fevers, night sweats.   She is concerned because her mother just had heart attack.   She admits to overall being more anxious with family illness, virtual school, covid craziness.   She also needs refills on all her medications.      Social History   Socioeconomic History  . Marital status: Married    Spouse name: Not on file  . Number of children: Not on file  . Years of education: Not on file  . Highest education level: Not on file  Occupational History  . Not on file  Social Needs  . Financial resource strain: Not on file  . Food insecurity    Worry: Not on file    Inability: Not on file  . Transportation needs    Medical: Not on file    Non-medical: Not on file  Tobacco Use  . Smoking status: Former Research scientist (life sciences)  . Smokeless tobacco: Never Used  Substance and Sexual Activity  . Alcohol use: Yes  . Drug use: No  . Sexual activity: Not on file  Lifestyle  . Physical activity    Days per week: Not on file    Minutes per session: Not on file  . Stress: Not on file  Relationships  . Social Herbalist on phone: Not on file    Gets together: Not on file    Attends religious service: Not on file    Active member of club or organization: Not on file    Attends meetings of clubs or organizations: Not on file    Relationship status: Not  on file  . Intimate partner violence    Fear of current or ex partner: Not on file    Emotionally abused: Not on file    Physically abused: Not on file    Forced sexual activity: Not on file  Other Topics Concern  . Not on file  Social History Narrative  . Not on file   Health Maintenance  Topic Date Due  . HIV Screening  07/31/1986  . PAP SMEAR-Modifier  02/20/2018  . MAMMOGRAM  10/26/2019  . TETANUS/TDAP  02/06/2022  . INFLUENZA VACCINE  Completed    The following portions of the patient's history were reviewed and updated as appropriate: allergies, current medications, past family history, past medical history, past social history, past surgical history and problem list.  Review of Systems Pertinent items noted in HPI and remainder of comprehensive ROS otherwise negative.   Objective:    BP 139/89   Pulse 92   Ht 5' 6.5" (1.689 m)   Wt 179 lb (81.2 kg)   SpO2 100%   PF 400 L/min   BMI 28.46 kg/m  General appearance: alert, cooperative and appears stated age Head: Normocephalic,  without obvious abnormality, atraumatic Eyes: conjunctivae/corneas clear. PERRL, EOM's intact. Fundi benign. Ears: normal TM's and external ear canals both ears Nose: Nares normal. Septum midline. Mucosa normal. No drainage or sinus tenderness. Throat: lips, mucosa, and tongue normal; teeth and gums normal Neck: no adenopathy, no carotid bruit, no JVD, supple, symmetrical, trachea midline and thyroid not enlarged, symmetric, no tenderness/mass/nodules Back: symmetric, no curvature. ROM normal. No CVA tenderness. Lungs: clear to auscultation bilaterally Heart: regular rate and rhythm, S1, S2 normal, no murmur, click, rub or gallop Abdomen: soft, non-tender; bowel sounds normal; no masses,  no organomegaly Extremities: extremities normal, atraumatic, no cyanosis or edema Pulses: 2+ and symmetric Skin: Skin color, texture, turgor normal. No rashes or lesions Lymph nodes: Cervical,  supraclavicular, and axillary nodes normal. Neurologic: Alert and oriented X 3, normal strength and tone. Normal symmetric reflexes. Normal coordination and gait    Assessment:    Healthy female exam.      Plan:      .Michelle Browning was seen today for annual exam.  Diagnoses and all orders for this visit:  Routine physical examination  Flu vaccine need -     Flu Vaccine QUAD 36+ mos IM  Acquired hypothyroidism -     Discontinue: levothyroxine (SYNTHROID) 150 MCG tablet; Take 1 tablet (150 mcg total) by mouth daily before breakfast. -     TSH -     T4, free -     T3, free  Mixed obsessional thoughts and acts -     escitalopram (LEXAPRO) 20 MG tablet; Take 1 tablet (20 mg total) by mouth daily. -     buPROPion (WELLBUTRIN XL) 300 MG 24 hr tablet; Take 1 tablet (300 mg total) by mouth daily.  Anxiety and depression -     escitalopram (LEXAPRO) 20 MG tablet; Take 1 tablet (20 mg total) by mouth daily. -     buPROPion (WELLBUTRIN XL) 300 MG 24 hr tablet; Take 1 tablet (300 mg total) by mouth daily.  Attention deficit hyperactivity disorder (ADHD), predominantly inattentive type -     lisdexamfetamine (VYVANSE) 50 MG capsule; Take 1 capsule (50 mg total) by mouth daily. -     lisdexamfetamine (VYVANSE) 50 MG capsule; Take 1 capsule (50 mg total) by mouth daily. -     lisdexamfetamine (VYVANSE) 50 MG capsule; TAKE 1 CAPSULE (50 MG TOTAL) BY MOUTH DAILY.  Cough -     DG Chest 2 View -     CBC with Differential/Platelet -     albuterol (VENTOLIN HFA) 108 (90 Base) MCG/ACT inhaler 2 puff  Weight loss -     DG Chest 2 View -     CBC with Differential/Platelet -     TSH -     T4, free -     T3, free -     Fe+TIBC+Fer -     EKG 12-Lead -     albuterol (VENTOLIN HFA) 108 (90 Base) MCG/ACT inhaler 2 puff  Screening for lipid disorders -     Lipid Panel w/reflex Direct LDL  Screening for diabetes mellitus -     COMPLETE METABOLIC PANEL WITH GFR  No energy -     B12 and Folate  Panel -     Vitamin D 1,25 dihydroxy -     Fe+TIBC+Fer  Other orders -     levothyroxine (SYNTHROID) 137 MCG tablet; Take 1 tablet (137 mcg total) by mouth daily before breakfast.   .. Depression screen [facility] 2/9 08/13/2019 08/08/2018  12/20/2017 08/04/2017 08/04/2017  Decreased Interest 2 0 0 0 0  Down, Depressed, Hopeless 1 0 0 0 0  PHQ - 2 Score 3 0 0 0 0  Altered sleeping 1 0 1 2 -  Tired, decreased energy 2 1 0 0 -  Change in appetite 0 0 0 0 -  Feeling bad or failure about yourself  0 0 0 0 -  Trouble concentrating 0 0 0 0 -  Moving slowly or fidgety/restless 0 0 0 0 -  Suicidal thoughts 0 0 0 0 -  PHQ-9 Score 6 1 1 2  -  Difficult doing work/chores Somewhat difficult Not difficult at all Not difficult at all Not difficult at all -   .. Discussed 150 minutes of exercise a week.  Encouraged vitamin D 1000 units and Calcium 1300mg  or 4 servings of dairy a day.  Mammogram up to date.  Needs pap. Follow up with GYN.  Fasting labs ordered.   EKG- NSR at 84. No ST depression or elevation. No arrhythmias.   Peak flow in office consistent ranged in the yellow range but no improvement with albuterol infact worsened. I do not suspect any asthma component. Pulse ox great today. Will get CXR. I do not think SOB is due to lungs. Will get labs.    See After Visit Summary for Counseling Recommendations

## 2019-09-24 MED FILL — LEVOTHYROXINE 137 MCG TABLE: 137 | 30 days supply | Qty: 30 | Fill #1

## 2019-09-24 MED FILL — VYVANSE 50 MG CAPSULE: 50 | 30 days supply | Qty: 30 | Fill #0

## 2019-10-30 ENCOUNTER — Other Ambulatory Visit: Payer: Self-pay | Admitting: Physician Assistant

## 2019-10-30 MED FILL — LEVOTHYROXINE 137 MCG TABLE: 137 | 30 days supply | Qty: 30 | Fill #0

## 2019-10-30 MED FILL — VYVANSE 50 MG CAPSULE: 50 | 30 days supply | Qty: 30 | Fill #0

## 2019-12-05 ENCOUNTER — Other Ambulatory Visit: Payer: Self-pay | Admitting: Physician Assistant

## 2019-12-05 DIAGNOSIS — E039 Hypothyroidism, unspecified: Secondary | ICD-10-CM

## 2019-12-05 DIAGNOSIS — F900 Attention-deficit hyperactivity disorder, predominantly inattentive type: Secondary | ICD-10-CM

## 2019-12-05 MED FILL — LEVOTHYROXINE 137 MCG TABLE: 137 | 30 days supply | Qty: 30 | Fill #0

## 2019-12-05 MED FILL — VYVANSE 50 MG CAPSULE: 50 | 30 days supply | Qty: 30 | Fill #0

## 2019-12-05 NOTE — Telephone Encounter (Signed)
Ok to order TSH to get drawn. I will refill for now.

## 2019-12-05 NOTE — Telephone Encounter (Signed)
No labs have been done. KG LPN

## 2019-12-05 NOTE — Telephone Encounter (Signed)
Lab ordered. Pt notified. KG LPN

## 2019-12-05 NOTE — Addendum Note (Signed)
Addended by: Clemetine Marker A on: 12/05/2019 02:48 PM   Modules accepted: Orders

## 2019-12-09 MED FILL — ESCITALOPRAM 20 MG TABLET: 20 | 90 days supply | Qty: 90 | Fill #1

## 2019-12-09 MED FILL — BUPROPION HCL XL 300 MG TAB: 300 | 30 days supply | Qty: 30 | Fill #1

## 2019-12-24 ENCOUNTER — Other Ambulatory Visit: Payer: Self-pay | Admitting: Physician Assistant

## 2019-12-24 DIAGNOSIS — Z1231 Encounter for screening mammogram for malignant neoplasm of breast: Secondary | ICD-10-CM

## 2019-12-27 LAB — TSH: TSH: 0.23 mIU/L — ABNORMAL LOW

## 2019-12-30 ENCOUNTER — Other Ambulatory Visit: Payer: Self-pay | Admitting: Physician Assistant

## 2019-12-30 MED ORDER — LEVOTHYROXINE SODIUM 125 MCG PO TABS: 125.0000 ug | ORAL_TABLET | Freq: Every day | ORAL | 0 refills | Status: DC

## 2019-12-30 MED FILL — LEVOTHYROXINE 125 MCG TABLE: 125 | 90 days supply | Qty: 90 | Fill #0

## 2019-12-30 NOTE — Telephone Encounter (Signed)
Michelle Browning,   TSH is almost in normal range. Do you feel any better from last visit? I think we should decrease just a hair more to get you in the normal range. It would be low normal but there. I will send over 171mcg to replace the 15mcg daily. Recheck in 3 months.

## 2020-01-01 ENCOUNTER — Ambulatory Visit (INDEPENDENT_AMBULATORY_CARE_PROVIDER_SITE_OTHER): Payer: No Typology Code available for payment source

## 2020-01-01 ENCOUNTER — Other Ambulatory Visit: Payer: Self-pay

## 2020-01-01 DIAGNOSIS — Z1231 Encounter for screening mammogram for malignant neoplasm of breast: Secondary | ICD-10-CM

## 2020-01-03 NOTE — Progress Notes (Signed)
Michelle Browning,   Normal mammogram. Follow up in 1year.

## 2020-01-14 ENCOUNTER — Other Ambulatory Visit: Payer: Self-pay | Admitting: Physician Assistant

## 2020-01-14 DIAGNOSIS — F900 Attention-deficit hyperactivity disorder, predominantly inattentive type: Secondary | ICD-10-CM

## 2020-01-14 MED FILL — BUPROPION HCL XL 300 MG TAB: 300 | 30 days supply | Qty: 30 | Fill #2

## 2020-01-14 MED FILL — VYVANSE 50 MG CAPSULE: 50 | 30 days supply | Qty: 30 | Fill #0

## 2020-01-14 NOTE — Telephone Encounter (Signed)
Last filled 12/05/2019 #30 with no refills Last appt 08/13/2019

## 2020-02-18 ENCOUNTER — Telehealth: Payer: Self-pay | Admitting: Physician Assistant

## 2020-02-18 DIAGNOSIS — F900 Attention-deficit hyperactivity disorder, predominantly inattentive type: Secondary | ICD-10-CM

## 2020-02-18 NOTE — Telephone Encounter (Signed)
Needs appt. Can be virtual.

## 2020-02-18 NOTE — Telephone Encounter (Signed)
Please schedule follow up for patient

## 2020-02-18 NOTE — Telephone Encounter (Signed)
Left VM to schedule 

## 2020-02-18 NOTE — Telephone Encounter (Signed)
Last filled 01/14/2020 #30 with no RF.  Last appt 08/13/2019.

## 2020-02-21 ENCOUNTER — Other Ambulatory Visit: Payer: Self-pay | Admitting: Physician Assistant

## 2020-02-21 ENCOUNTER — Ambulatory Visit (INDEPENDENT_AMBULATORY_CARE_PROVIDER_SITE_OTHER): Payer: No Typology Code available for payment source | Admitting: Physician Assistant

## 2020-02-21 ENCOUNTER — Encounter: Payer: Self-pay | Admitting: Physician Assistant

## 2020-02-21 ENCOUNTER — Other Ambulatory Visit: Payer: Self-pay

## 2020-02-21 VITALS — BP 118/87 | HR 99 | Wt 191.0 lb

## 2020-02-21 DIAGNOSIS — E039 Hypothyroidism, unspecified: Secondary | ICD-10-CM | POA: Diagnosis not present

## 2020-02-21 DIAGNOSIS — F422 Mixed obsessional thoughts and acts: Secondary | ICD-10-CM | POA: Diagnosis not present

## 2020-02-21 DIAGNOSIS — E6609 Other obesity due to excess calories: Secondary | ICD-10-CM

## 2020-02-21 DIAGNOSIS — F330 Major depressive disorder, recurrent, mild: Secondary | ICD-10-CM

## 2020-02-21 DIAGNOSIS — F419 Anxiety disorder, unspecified: Secondary | ICD-10-CM

## 2020-02-21 DIAGNOSIS — F900 Attention-deficit hyperactivity disorder, predominantly inattentive type: Secondary | ICD-10-CM | POA: Diagnosis not present

## 2020-02-21 DIAGNOSIS — Z6830 Body mass index (BMI) 30.0-30.9, adult: Secondary | ICD-10-CM

## 2020-02-21 DIAGNOSIS — F329 Major depressive disorder, single episode, unspecified: Secondary | ICD-10-CM

## 2020-02-21 MED ORDER — BUPROPION HCL ER (XL) 300 MG PO TB24: 300.0000 mg | ORAL_TABLET | Freq: Every day | ORAL | 3 refills | Status: DC

## 2020-02-21 MED ORDER — LISDEXAMFETAMINE DIMESYLATE 60 MG PO CAPS: 60.0000 mg | ORAL_CAPSULE | ORAL | 0 refills | Status: DC

## 2020-02-21 MED ORDER — ESCITALOPRAM OXALATE 20 MG PO TABS: 20.0000 mg | ORAL_TABLET | Freq: Every day | ORAL | 3 refills | Status: DC

## 2020-02-21 MED FILL — VYVANSE 60 MG CAPSULE: 60 | 30 days supply | Qty: 30 | Fill #0

## 2020-02-21 MED FILL — buPROPion HCL ER (XL) 300 M: 300 | 90 days supply | Qty: 90 | Fill #0

## 2020-02-21 MED FILL — ESCITALOPRAM 20 MG TABLET: 20 | 90 days supply | Qty: 90 | Fill #0

## 2020-02-21 NOTE — Progress Notes (Signed)
Subjective:    Patient ID: Michelle Browning, female    DOB: 1971/02/02, 49 y.o.   MRN: [id number]  HPI Pt is a 49 yo female with ADHD, MDD, GAD, hypothyroidism who presents to the clinic for medication refills.   She is doing well with her mood. She is very frustrated with her weight. She continues to gain and lose. She is not exercising or eating healthy.   She feels like vyvanse is not working as well as it once did. She would like increase.   No increase in palpitaitons, anxiety, headaches, insomnia.   .. Active Ambulatory Problems    Diagnosis Date Noted  . Thyroid cancer ([place]) 02/07/2012  . Anxiety and depression 02/07/2012  . Fatigue 02/07/2012  . Hypothyroidism 02/20/2013  . Hyperlipidemia 05/26/2014  . Decreased thyroid stimulating hormone level 06/11/2014  . Overweight (BMI 25.0-29.9) 03/29/2015  . Abnormal weight gain 12/25/2015  . Class 1 obesity due to excess calories without serious comorbidity with body mass index (BMI) of 30.0 to 30.9 in adult 12/25/2015  . Inattention 12/25/2015  . OCD (obsessive compulsive disorder) 01/27/2016  . Attention deficit hyperactivity disorder (ADHD) 01/27/2016  . 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 No Resolved Ambulatory Problems   Past Medical History:  Diagnosis Date  . Depression   . Thyroid disease       Review of Systems  All other systems reviewed and are negative.      Objective:   Physical Exam Vitals reviewed.  Constitutional:      Appearance: Normal appearance.  Cardiovascular:     Rate and Rhythm: Normal rate and regular rhythm.     Pulses: Normal pulses.     Heart sounds: Normal heart sounds.  Pulmonary:     Effort: Pulmonary effort is normal.     Breath sounds: Normal breath sounds.  Neurological:     General: No focal deficit present.     Mental Status: She is alert and oriented to person, place, and  time.  Psychiatric:        Mood and Affect: Mood normal.          Assessment & Plan:  .Marland KitchenBreyah was seen today for follow-up.  Diagnoses and all orders for this visit:  Attention deficit hyperactivity disorder (ADHD), predominantly inattentive type -     lisdexamfetamine (VYVANSE) 60 MG capsule; Take 1 capsule (60 mg total) by mouth every morning. -     lisdexamfetamine (VYVANSE) 60 MG capsule; Take 1 capsule (60 mg total) by mouth every morning. -     lisdexamfetamine (VYVANSE) 60 MG capsule; Take 1 capsule (60 mg total) by mouth every morning.  Acquired hypothyroidism  Class 1 obesity due to excess calories without serious comorbidity with body mass index (BMI) of 30.0 to 30.9 in adult  Mixed obsessional thoughts and acts -     escitalopram (LEXAPRO) 20 MG tablet; Take 1 tablet (20 mg total) by mouth daily. -     buPROPion (WELLBUTRIN XL) 300 MG 24 hr tablet; Take 1 tablet (300 mg total) by mouth daily.  Anxiety and depression -     escitalopram (LEXAPRO) 20 MG tablet; Take 1 tablet (20 mg total) by mouth daily. -     buPROPion (WELLBUTRIN XL) 300 MG 24 hr tablet; Take 1 tablet (300 mg total) by mouth daily.  MDD (major depressive disorder), recurrent episode, mild ([place]) -  escitalopram (LEXAPRO) 20 MG tablet; Take 1 tablet (20 mg total) by mouth daily. -     buPROPion (WELLBUTRIN XL) 300 MG 24 hr tablet; Take 1 tablet (300 mg total) by mouth daily.   Refilled vyvanse. Increased to 60mg .  Follow up in 3 months.   Needs pap. Schedule in 3 months.   Refilled lexapro/wellbutrin for 1 year.   Marland Kitchen.Discussed low carb diet with 1500 calories and 80g of protein.  Exercising at least 150 minutes a week.  [employer] could be a Microbiologist.  Discussed medications and IF 16:8. Will try IF.  Follow up in 3 months.

## 2020-03-30 ENCOUNTER — Other Ambulatory Visit: Payer: Self-pay | Admitting: Neurology

## 2020-03-30 ENCOUNTER — Other Ambulatory Visit: Payer: Self-pay | Admitting: Physician Assistant

## 2020-03-30 DIAGNOSIS — E039 Hypothyroidism, unspecified: Secondary | ICD-10-CM

## 2020-03-30 MED FILL — LEVOTHYROXINE 125 MCG TABLE: 125 | 30 days supply | Qty: 30 | Fill #0

## 2020-03-30 MED FILL — VYVANSE 60 MG CAPSULE: 60 | 30 days supply | Qty: 30 | Fill #0

## 2020-05-05 MED FILL — VYVANSE 60 MG CAPSULE: 60 | 30 days supply | Qty: 30 | Fill #0

## 2020-05-25 ENCOUNTER — Ambulatory Visit (INDEPENDENT_AMBULATORY_CARE_PROVIDER_SITE_OTHER): Payer: No Typology Code available for payment source | Admitting: Physician Assistant

## 2020-05-25 ENCOUNTER — Other Ambulatory Visit (HOSPITAL_COMMUNITY)
Admission: RE | Admit: 2020-05-25 | Discharge: 2020-05-25 | Disposition: A | Discharge status: Home / Self Care | Payer: No Typology Code available for payment source | Source: Ambulatory Visit | Attending: Physician Assistant | Admitting: Physician Assistant

## 2020-05-25 ENCOUNTER — Encounter: Payer: Self-pay | Admitting: Physician Assistant

## 2020-05-25 ENCOUNTER — Other Ambulatory Visit: Payer: Self-pay | Admitting: Physician Assistant

## 2020-05-25 VITALS — BP 135/92 | HR 71 | Ht 66.5 in | Wt 191.0 lb

## 2020-05-25 DIAGNOSIS — Z114 Encounter for screening for human immunodeficiency virus [HIV]: Secondary | ICD-10-CM

## 2020-05-25 DIAGNOSIS — Z124 Encounter for screening for malignant neoplasm of cervix: Secondary | ICD-10-CM | POA: Diagnosis not present

## 2020-05-25 DIAGNOSIS — Z1159 Encounter for screening for other viral diseases: Secondary | ICD-10-CM

## 2020-05-25 DIAGNOSIS — E039 Hypothyroidism, unspecified: Secondary | ICD-10-CM

## 2020-05-25 DIAGNOSIS — F900 Attention-deficit hyperactivity disorder, predominantly inattentive type: Secondary | ICD-10-CM | POA: Diagnosis not present

## 2020-05-25 MED ORDER — LISDEXAMFETAMINE DIMESYLATE 60 MG PO CAPS: 60.0000 mg | ORAL_CAPSULE | ORAL | 0 refills | Status: DC

## 2020-05-25 MED FILL — LEVOTHYROXINE 125 MCG TABLE: 125 | 30 days supply | Qty: 30 | Fill #0

## 2020-05-25 NOTE — Progress Notes (Signed)
Subjective:     Michelle Browning is a 49 y.o. woman who comes in today for a  pap smear only. Her most recent annual exam was on 08/24/2020. Her most recent Pap smear was on 02/20/2013  and showed no abnormalities. Previous abnormal Pap smears: no. Contraception: vasectomy   Pt is doing well on vyanse. No concerns or complaints. No mood changes.   The following portions of the patient's history were reviewed and updated as appropriate: allergies, current medications, past family history, past medical history, past social history, past surgical history and problem list.  Review of Systems A comprehensive review of systems was negative.   Objective:    There were no vitals taken for this visit. Pelvic Exam: cervix normal in appearance, external genitalia normal and vagina normal without discharge. Pap smear obtained NSR, lungs clear, no murmurs.   Assessment:    Screening pap smear.   Plan:    Follow up in 5 years, or as indicated by Pap results .Marland KitchenCaree was seen today for gynecologic exam.  Diagnoses and all orders for this visit:  Acquired hypothyroidism -     TSH  Attention deficit hyperactivity disorder (ADHD), predominantly inattentive type -     lisdexamfetamine (VYVANSE) 60 MG capsule; Take 1 capsule (60 mg total) by mouth every morning. -     lisdexamfetamine (VYVANSE) 60 MG capsule; Take 1 capsule (60 mg total) by mouth every morning. -     lisdexamfetamine (VYVANSE) 60 MG capsule; Take 1 capsule (60 mg total) by mouth every morning.  Encounter for hepatitis C screening test for low risk patient -     Hepatitis C Antibody  Encounter for screening for human immunodeficiency virus (HIV) -     HIV antibody (with reflex)  Papanicolaou smear -     Cytology - PAP   Need to recheck TSH.  STD declined.  Vyvanse refilled for 3 months.

## 2020-05-26 ENCOUNTER — Other Ambulatory Visit: Payer: Self-pay | Admitting: Physician Assistant

## 2020-05-26 DIAGNOSIS — E039 Hypothyroidism, unspecified: Secondary | ICD-10-CM

## 2020-05-26 LAB — CYTOLOGY - PAP
Comment: NEGATIVE
Diagnosis: NEGATIVE
High risk HPV: NEGATIVE

## 2020-05-26 LAB — HEPATITIS C ANTIBODY
Hepatitis C Ab: NONREACTIVE
SIGNAL TO CUT-OFF: 0.02 (ref <1.00)

## 2020-05-26 LAB — HIV ANTIBODY (ROUTINE TESTING W REFLEX): HIV 1&2 Ab, 4th Generation: NONREACTIVE

## 2020-05-26 LAB — TSH: TSH: 0.08 mIU/L — ABNORMAL LOW

## 2020-05-26 MED ORDER — LEVOTHYROXINE SODIUM 112 MCG PO TABS
112.0000 ug | ORAL_TABLET | Freq: Every day | ORAL | 1 refills | Status: DC
Start: 2020-05-26 — End: 2020-08-18

## 2020-05-26 MED FILL — LEVOTHYROXINE SODIUM 112 MC: 112 | 30 days supply | Qty: 30 | Fill #0

## 2020-05-26 NOTE — Progress Notes (Signed)
[place],   Negative HPV and normal cells. Great pap. Next pap 5 years.

## 2020-05-26 NOTE — Progress Notes (Signed)
TSH shows HYPER thyroid state. Need to decrease levothyroxine and recheck in 6 weeks lab only. Sent new medication to pharmacy.

## 2020-05-29 MED FILL — VYVANSE 60 MG CAPSULE: 60 | 30 days supply | Qty: 30 | Fill #0

## 2020-06-01 MED FILL — ESCITALOPRAM 20 MG TABLET: 20 | 90 days supply | Qty: 90 | Fill #1

## 2020-06-01 MED FILL — buPROPion HCL ER (XL) 300 M: 300 | 90 days supply | Qty: 90 | Fill #1

## 2020-07-14 MED FILL — LEVOTHYROXINE SODIUM 112 MC: 112 | 30 days supply | Qty: 30 | Fill #1

## 2020-07-14 MED FILL — VYVANSE 60 MG CAPSULE: 60 | 30 days supply | Qty: 30 | Fill #0

## 2020-08-05 ENCOUNTER — Ambulatory Visit: Payer: No Typology Code available for payment source | Admitting: Physician Assistant

## 2020-08-05 ENCOUNTER — Ambulatory Visit (INDEPENDENT_AMBULATORY_CARE_PROVIDER_SITE_OTHER): Payer: No Typology Code available for payment source | Admitting: Physician Assistant

## 2020-08-05 ENCOUNTER — Other Ambulatory Visit: Payer: Self-pay

## 2020-08-05 VITALS — BP 121/80 | HR 87 | Ht 66.5 in | Wt 192.0 lb

## 2020-08-05 DIAGNOSIS — Z131 Encounter for screening for diabetes mellitus: Secondary | ICD-10-CM

## 2020-08-05 DIAGNOSIS — F900 Attention-deficit hyperactivity disorder, predominantly inattentive type: Secondary | ICD-10-CM

## 2020-08-05 DIAGNOSIS — Z79899 Other long term (current) drug therapy: Secondary | ICD-10-CM

## 2020-08-05 DIAGNOSIS — K219 Gastro-esophageal reflux disease without esophagitis: Secondary | ICD-10-CM | POA: Diagnosis not present

## 2020-08-05 DIAGNOSIS — E7800 Pure hypercholesterolemia, unspecified: Secondary | ICD-10-CM

## 2020-08-05 DIAGNOSIS — Z23 Encounter for immunization: Secondary | ICD-10-CM

## 2020-08-05 DIAGNOSIS — E039 Hypothyroidism, unspecified: Secondary | ICD-10-CM

## 2020-08-05 MED ORDER — LISDEXAMFETAMINE DIMESYLATE 60 MG PO CAPS: 60.0000 mg | ORAL_CAPSULE | ORAL | 0 refills | Status: DC

## 2020-08-05 NOTE — Progress Notes (Signed)
Subjective:    Patient ID: Michelle Browning, female    DOB: 03/20/1971, 49 y.o.   MRN: [id number]  HPI  Pt is a 49 yo female with ADHD, hypothyroidism, HLD, OCD, MDD who presents to the clinic for medication refills.   Medication is doing well. ADHD controlled and doing well at work and home. Mood is stable. No SI/HC.   She is having some increasing problems with GERD and indigestion. She is not on medication and never really had any problems with this. Denies any melena or hematochezia. No vomiting. Symptoms can wake her up at night and cause throat burning. She had not made any major diet changes. She is taking tums which do help some.   .. Active Ambulatory Problems    Diagnosis Date Noted  . Thyroid cancer ([place]) 02/07/2012  . Anxiety and depression 02/07/2012  . Fatigue 02/07/2012  . Hypothyroidism 02/20/2013  . Hyperlipidemia 05/26/2014  . Decreased thyroid stimulating hormone level 06/11/2014  . Overweight (BMI 25.0-29.9) 03/29/2015  . Abnormal weight gain 12/25/2015  . Class 1 obesity due to excess calories without serious comorbidity with body mass index (BMI) of 30.0 to 30.9 in adult 12/25/2015  . Inattention 12/25/2015  . OCD (obsessive compulsive disorder) 01/27/2016  . Attention deficit hyperactivity disorder (ADHD) 01/27/2016  . MDD (major depressive disorder), recurrent episode, mild ([place]) 09/11/2017  . Elevated LDL cholesterol level 08/11/2020   Resolved Ambulatory Problems    Diagnosis Date Noted  . No Resolved Ambulatory Problems   Past Medical History:  Diagnosis Date  . Depression   . Thyroid disease        Review of Systems  All other systems reviewed and are negative.      Objective:   Physical Exam Vitals reviewed.  Constitutional:      Appearance: Normal appearance. She is obese.  Cardiovascular:     Rate and Rhythm: Normal rate and regular rhythm.     Pulses: Normal pulses.  Pulmonary:     Effort: Pulmonary effort is normal.      Breath sounds: Normal breath sounds.  Abdominal:     General: Bowel sounds are normal. There is no distension.     Palpations: Abdomen is soft.     Tenderness: There is no abdominal tenderness. There is no right CVA tenderness, left CVA tenderness, guarding or rebound.  Neurological:     General: No focal deficit present.     Mental Status: She is alert.  Psychiatric:        Mood and Affect: Mood normal.           Assessment & Plan:  .Marland KitchenRosha was seen today for adhd.  Diagnoses and all orders for this visit:  Attention deficit hyperactivity disorder (ADHD), predominantly inattentive type -     lisdexamfetamine (VYVANSE) 60 MG capsule; Take 1 capsule (60 mg total) by mouth every morning. -     lisdexamfetamine (VYVANSE) 60 MG capsule; Take 1 capsule (60 mg total) by mouth every morning. -     lisdexamfetamine (VYVANSE) 60 MG capsule; Take 1 capsule (60 mg total) by mouth every morning.  Flu vaccine need -     Flu Vaccine QUAD 36+ mos IM  Hypothyroidism, unspecified type -     TSH  Gastroesophageal reflux disease, unspecified whether esophagitis present -     TSH -     H. pylori breath test  Screening for diabetes mellitus -     COMPLETE METABOLIC PANEL WITH GFR  Medication  management -     COMPLETE METABOLIC PANEL WITH GFR  Elevated LDL cholesterol level -     Lipid Panel w/reflex Direct LDL   Medications refilled.  Labs ordered.  TSH ordered for thyroid management will adjust accordingly.  Suggest h..pylori testing due to sudden onset of GERD symptoms. Suggest trial of pepcid after testing done. Watch any NSAID use. GERD diet. Follow up as needed.

## 2020-08-11 ENCOUNTER — Encounter: Payer: Self-pay | Admitting: Physician Assistant

## 2020-08-11 DIAGNOSIS — E7800 Pure hypercholesterolemia, unspecified: Secondary | ICD-10-CM | POA: Insufficient documentation

## 2020-08-18 ENCOUNTER — Other Ambulatory Visit: Payer: Self-pay | Admitting: Physician Assistant

## 2020-08-18 MED FILL — buPROPion HCL ER (XL) 300 M: 300 | 90 days supply | Qty: 90 | Fill #2

## 2020-08-18 MED FILL — LEVOTHYROXINE SODIUM 112 MC: 112 | 30 days supply | Qty: 30 | Fill #0

## 2020-08-18 MED FILL — VYVANSE 60 MG CAPSULE: 60 | 30 days supply | Qty: 30 | Fill #0

## 2020-08-18 MED FILL — ESCITALOPRAM 20 MG TABLET: 20 | 90 days supply | Qty: 90 | Fill #2

## 2020-09-15 MED FILL — VYVANSE 60 MG CAPSULE: 60 | 30 days supply | Qty: 30 | Fill #0

## 2020-10-01 ENCOUNTER — Other Ambulatory Visit: Payer: Self-pay | Admitting: Physician Assistant

## 2020-10-01 MED FILL — LEVOTHYROXINE SODIUM 112 MC: 112 | 15 days supply | Qty: 15 | Fill #0

## 2020-10-02 ENCOUNTER — Other Ambulatory Visit: Payer: Self-pay | Admitting: Physician Assistant

## 2020-10-02 DIAGNOSIS — F900 Attention-deficit hyperactivity disorder, predominantly inattentive type: Secondary | ICD-10-CM

## 2020-10-07 ENCOUNTER — Telehealth: Payer: Self-pay | Admitting: Neurology

## 2020-10-07 NOTE — Telephone Encounter (Signed)
Patient called for [place] to send lab order to [facility] downstairs, she is going today to have done. Called back and LMOM letting her know labs are in their system and to call with any problems.

## 2020-10-08 LAB — TSH: TSH: 0.08 mIU/L — ABNORMAL LOW

## 2020-10-08 LAB — LIPID PANEL W/REFLEX DIRECT LDL
Cholesterol: 270 mg/dL — ABNORMAL HIGH (ref <200)
HDL: 70 mg/dL (ref >=50)
LDL Cholesterol (Calc): 175 mg/dL (calc) — ABNORMAL HIGH
Non-HDL Cholesterol (Calc): 200 mg/dL (calc) — ABNORMAL HIGH (ref <130)
Total CHOL/HDL Ratio: 3.9 (calc) (ref <5.0)
Triglycerides: 122 mg/dL (ref <150)

## 2020-10-08 LAB — COMPLETE METABOLIC PANEL WITH GFR
AG Ratio: 1.4 (calc) (ref 1.0–2.5)
ALT: 14 U/L (ref 6–29)
AST: 18 U/L (ref 10–35)
Albumin: 4.1 g/dL (ref 3.6–5.1)
Alkaline phosphatase (APISO): 59 U/L (ref 31–125)
BUN/Creatinine Ratio: 6 (calc) (ref 6–22)
BUN: 6 mg/dL — ABNORMAL LOW (ref 7–25)
CO2: 25 mmol/L (ref 20–32)
Calcium: 8.9 mg/dL (ref 8.6–10.2)
Chloride: 105 mmol/L (ref 98–110)
Creat: 0.99 mg/dL (ref 0.50–1.10)
GFR, Est African American: 78 mL/min/{1.73_m2} (ref >=60)
GFR, Est Non African American: 67 mL/min/{1.73_m2} (ref >=60)
Globulin: 2.9 g/dL (calc) (ref 1.9–3.7)
Glucose, Bld: 79 mg/dL (ref 65–99)
Potassium: 4.1 mmol/L (ref 3.5–5.3)
Sodium: 138 mmol/L (ref 135–146)
Total Bilirubin: 0.3 mg/dL (ref 0.2–1.2)
Total Protein: 7 g/dL (ref 6.1–8.1)

## 2020-10-09 ENCOUNTER — Other Ambulatory Visit: Payer: Self-pay | Admitting: Physician Assistant

## 2020-10-09 MED ORDER — LEVOTHYROXINE SODIUM 100 MCG PO TABS: 100.0000 ug | ORAL_TABLET | Freq: Every day | ORAL | 0 refills | Status: DC

## 2020-10-09 MED FILL — LEVOTHYROXINE SODIUM 100 MC: 100 | 90 days supply | Qty: 90 | Fill #0

## 2020-10-09 NOTE — Progress Notes (Signed)
Di Kindle,   Did you get the new vyvanse prescription?   Your TSH is still suppressed. Will decrease a little more and then recheck in 3 months.

## 2020-11-04 ENCOUNTER — Other Ambulatory Visit: Payer: Self-pay | Admitting: Physician Assistant

## 2020-11-04 ENCOUNTER — Other Ambulatory Visit: Payer: Self-pay

## 2020-11-04 ENCOUNTER — Ambulatory Visit (INDEPENDENT_AMBULATORY_CARE_PROVIDER_SITE_OTHER): Payer: No Typology Code available for payment source | Admitting: Physician Assistant

## 2020-11-04 ENCOUNTER — Encounter: Payer: Self-pay | Admitting: Physician Assistant

## 2020-11-04 VITALS — BP 128/78 | HR 91 | Ht 66.5 in | Wt 188.0 lb

## 2020-11-04 DIAGNOSIS — F422 Mixed obsessional thoughts and acts: Secondary | ICD-10-CM

## 2020-11-04 DIAGNOSIS — E039 Hypothyroidism, unspecified: Secondary | ICD-10-CM | POA: Diagnosis not present

## 2020-11-04 DIAGNOSIS — F330 Major depressive disorder, recurrent, mild: Secondary | ICD-10-CM | POA: Diagnosis not present

## 2020-11-04 DIAGNOSIS — F900 Attention-deficit hyperactivity disorder, predominantly inattentive type: Secondary | ICD-10-CM

## 2020-11-04 MED ORDER — LISDEXAMFETAMINE DIMESYLATE 60 MG PO CAPS: 60.0000 mg | ORAL_CAPSULE | ORAL | 0 refills | Status: DC

## 2020-11-04 MED FILL — VYVANSE 60 MG CAPSULE: 60 | 30 days supply | Qty: 30 | Fill #0

## 2020-11-04 NOTE — Progress Notes (Signed)
Subjective:    Patient ID: Michelle Browning, female    DOB: December 23, 1970, 49 y.o.   MRN: [id number]  HPI  Pt is a 49 yo female with ADHD, MDD, GAD, hypothyroidism who presents to the clinic for refill.   She is doing well. She denies any insomnia, anxiety, palpitations. Her work is stressful but manageable. Her mood is stable. No SI/HC.   TSH was low at last visit. Levothyroxine reduced. Needs rechecked.   .. Active Ambulatory Problems    Diagnosis Date Noted  . Thyroid cancer ([place]) 02/07/2012  . Anxiety and depression 02/07/2012  . Fatigue 02/07/2012  . Hypothyroidism 02/20/2013  . Hyperlipidemia 05/26/2014  . Decreased thyroid stimulating hormone level 06/11/2014  . Overweight (BMI 25.0-29.9) 03/29/2015  . Abnormal weight gain 12/25/2015  . Class 1 obesity due to excess calories without serious comorbidity with body mass index (BMI) of 30.0 to 30.9 in adult 12/25/2015  . Inattention 12/25/2015  . OCD (obsessive compulsive disorder) 01/27/2016  . Attention deficit hyperactivity disorder (ADHD) 01/27/2016  . MDD (major depressive disorder), recurrent episode, mild ([place]) 09/11/2017  . Elevated LDL cholesterol level 08/11/2020   Resolved Ambulatory Problems    Diagnosis Date Noted  . No Resolved Ambulatory Problems   Past Medical History:  Diagnosis Date  . Depression   . Thyroid disease         Review of Systems  All other systems reviewed and are negative.      Objective:   Physical Exam Vitals reviewed.  Constitutional:      Appearance: Normal appearance.  Cardiovascular:     Rate and Rhythm: Normal rate and regular rhythm.     Pulses: Normal pulses.  Pulmonary:     Effort: Pulmonary effort is normal.     Breath sounds: Normal breath sounds.  Neurological:     General: No focal deficit present.     Mental Status: She is alert and oriented to person, place, and time.  Psychiatric:        Mood and Affect: Mood normal.      .. Depression screen [facility] 2/9 11/04/2020 08/05/2020 05/25/2020 08/13/2019 08/08/2018  Decreased Interest 0 1 0 2 0  Down, Depressed, Hopeless 0 0 0 1 0  PHQ - 2 Score 0 1 0 3 0  Altered sleeping 0 0 2 1 0  Tired, decreased energy 0 0 2 2 1   Change in appetite 0 0 1 0 0  Feeling bad or failure about yourself  0 0 0 0 0  Trouble concentrating 1 0 0 0 0  Moving slowly or fidgety/restless 0 0 0 0 0  Suicidal thoughts 0 0 0 0 0  PHQ-9 Score 1 1 5 6 1   Difficult doing work/chores Not difficult at all Not difficult at all Not difficult at all Somewhat difficult Not difficult at all   .Marland Kitchen GAD 7 : Generalized Anxiety Score 11/04/2020 08/05/2020 05/25/2020 08/13/2019  Nervous, Anxious, on Edge 0 0 0 2  Control/stop worrying 0 0 0 2  Worry too much - different things 0 0 0 2  Trouble relaxing 0 0 0 2  Restless 0 0 0 0  Easily annoyed or irritable 0 1 0 3  Afraid - awful might happen 0 0 0 0  Total GAD 7 Score 0 1 0 11  Anxiety Difficulty Not difficult at all Not difficult at all Not difficult at all Somewhat difficult          Assessment & Plan:  Marland KitchenMarland Kitchen  Michelle Browning was seen today for adhd.  Diagnoses and all orders for this visit:  Acquired hypothyroidism -     TSH  Attention deficit hyperactivity disorder (ADHD), predominantly inattentive type -     lisdexamfetamine (VYVANSE) 60 MG capsule; Take 1 capsule (60 mg total) by mouth every morning. -     lisdexamfetamine (VYVANSE) 60 MG capsule; Take 1 capsule (60 mg total) by mouth every morning. -     lisdexamfetamine (VYVANSE) 60 MG capsule; Take 1 capsule (60 mg total) by mouth every morning.  Mixed obsessional thoughts and acts  MDD (major depressive disorder), recurrent episode, mild ([facility])   TSH ordered. Will adjust medications accordingly.  vyvanse refilled.  PHQ/GAD stable.  Follow up in 3 months.

## 2020-11-06 ENCOUNTER — Encounter: Payer: Self-pay | Admitting: Physician Assistant

## 2020-12-14 MED FILL — ESCITALOPRAM 20 MG TABLET: 20 | 90 days supply | Qty: 90 | Fill #3

## 2020-12-14 MED FILL — buPROPion HCL ER (XL) 300 M: 300 | 90 days supply | Qty: 90 | Fill #3

## 2020-12-14 MED FILL — VYVANSE 60 MG CAPSULE: 60 | 30 days supply | Qty: 30 | Fill #0

## 2021-01-19 ENCOUNTER — Other Ambulatory Visit: Payer: Self-pay | Admitting: Physician Assistant

## 2021-01-19 MED FILL — LEVOTHYROXINE SODIUM 100 MC: 100 | 90 days supply | Qty: 90 | Fill #0

## 2021-01-19 MED FILL — VYVANSE 60 MG CAPSULE: 60 | 30 days supply | Qty: 30 | Fill #0

## 2021-02-02 ENCOUNTER — Ambulatory Visit: Payer: No Typology Code available for payment source | Admitting: Physician Assistant

## 2021-02-02 DIAGNOSIS — F900 Attention-deficit hyperactivity disorder, predominantly inattentive type: Secondary | ICD-10-CM

## 2021-02-08 ENCOUNTER — Ambulatory Visit (INDEPENDENT_AMBULATORY_CARE_PROVIDER_SITE_OTHER): Payer: 59 | Admitting: Physician Assistant

## 2021-02-08 ENCOUNTER — Encounter: Payer: Self-pay | Admitting: Physician Assistant

## 2021-02-08 ENCOUNTER — Other Ambulatory Visit: Payer: Self-pay

## 2021-02-08 ENCOUNTER — Other Ambulatory Visit: Payer: Self-pay | Admitting: Physician Assistant

## 2021-02-08 VITALS — BP 117/75 | HR 88 | Ht 66.5 in | Wt 190.0 lb

## 2021-02-08 DIAGNOSIS — Z1211 Encounter for screening for malignant neoplasm of colon: Secondary | ICD-10-CM

## 2021-02-08 DIAGNOSIS — E6609 Other obesity due to excess calories: Secondary | ICD-10-CM | POA: Diagnosis not present

## 2021-02-08 DIAGNOSIS — E039 Hypothyroidism, unspecified: Secondary | ICD-10-CM | POA: Diagnosis not present

## 2021-02-08 DIAGNOSIS — F330 Major depressive disorder, recurrent, mild: Secondary | ICD-10-CM

## 2021-02-08 DIAGNOSIS — F900 Attention-deficit hyperactivity disorder, predominantly inattentive type: Secondary | ICD-10-CM

## 2021-02-08 DIAGNOSIS — Z1231 Encounter for screening mammogram for malignant neoplasm of breast: Secondary | ICD-10-CM

## 2021-02-08 DIAGNOSIS — F422 Mixed obsessional thoughts and acts: Secondary | ICD-10-CM

## 2021-02-08 DIAGNOSIS — Z6830 Body mass index (BMI) 30.0-30.9, adult: Secondary | ICD-10-CM

## 2021-02-08 DIAGNOSIS — F419 Anxiety disorder, unspecified: Secondary | ICD-10-CM

## 2021-02-08 DIAGNOSIS — F32A Depression, unspecified: Secondary | ICD-10-CM

## 2021-02-08 MED ORDER — LISDEXAMFETAMINE DIMESYLATE 60 MG PO CAPS: 60.0000 mg | ORAL_CAPSULE | ORAL | 0 refills | Status: DC

## 2021-02-08 MED ORDER — ESCITALOPRAM OXALATE 20 MG PO TABS: 20.0000 mg | ORAL_TABLET | Freq: Every day | ORAL | 3 refills | Status: DC

## 2021-02-08 MED ORDER — WEGOVY 0.25 MG/0.5ML ~~LOC~~ SOAJ: 0.2500 mg | SUBCUTANEOUS | 0 refills | Status: DC

## 2021-02-08 NOTE — Progress Notes (Signed)
Subjective:    Patient ID: Michelle Browning, female    DOB: [birth year], 50 y.o.   MRN: [id number]  HPI  Patient is a 50 year old obese female with hypothyroidism, ADHD, hyperlipidemia, anxiety, MDD, OCD who presents to the clinic for medication refills.  Overall she is feeling great.  She denies any significant issues or concerns.  She denies any worsening anxiety or mood issues.  She denies any suicidal thoughts or homicidal idealizations.  She is doing well at work and staying productive.  Patient has not had labs for her thyroid function recently.   Patient continues to struggle with weight loss.  .. Active Ambulatory Problems    Diagnosis Date Noted  . Thyroid cancer ([place]) 02/07/2012  . Anxiety and depression 02/07/2012  . Fatigue 02/07/2012  . Hypothyroidism 02/20/2013  . Hyperlipidemia 05/26/2014  . Decreased thyroid stimulating hormone level 06/11/2014  . Overweight (BMI 25.0-29.9) 03/29/2015  . Abnormal weight gain 12/25/2015  . Class 1 obesity due to excess calories without serious comorbidity with body mass index (BMI) of 30.0 to 30.9 in adult 12/25/2015  . Inattention 12/25/2015  . OCD (obsessive compulsive disorder) 01/27/2016  . Attention deficit hyperactivity disorder (ADHD) 01/27/2016  . MDD (major depressive disorder), recurrent episode, mild ([place]) 09/11/2017  . Elevated LDL cholesterol level 08/11/2020   Resolved Ambulatory Problems    Diagnosis Date Noted  . No Resolved Ambulatory Problems   Past Medical History:  Diagnosis Date  . Depression   . Thyroid disease      Review of Systems  All other systems reviewed and are negative.      Objective:   Physical Exam Vitals reviewed.  Constitutional:      Appearance: Normal appearance. She is obese.  Neck:     Vascular: No carotid bruit.  Cardiovascular:     Rate and Rhythm: Normal rate and regular rhythm.     Pulses: Normal pulses.     Heart sounds: Normal heart sounds.  Pulmonary:      Effort: Pulmonary effort is normal.     Breath sounds: Normal breath sounds.  Lymphadenopathy:     Cervical: No cervical adenopathy.  Neurological:     General: No focal deficit present.     Mental Status: She is alert and oriented to person, place, and time.  Psychiatric:        Mood and Affect: Mood normal.    .. Depression screen [facility] 2/9 02/08/2021 11/04/2020 08/05/2020 05/25/2020 08/13/2019  Decreased Interest 0 0 1 0 2  Down, Depressed, Hopeless 0 0 0 0 1  PHQ - 2 Score 0 0 1 0 3  Altered sleeping 0 0 0 2 1  Tired, decreased energy 0 0 0 2 2  Change in appetite 0 0 0 1 0  Feeling bad or failure about yourself  0 0 0 0 0  Trouble concentrating 0 1 0 0 0  Moving slowly or fidgety/restless 0 0 0 0 0  Suicidal thoughts 0 0 0 0 0  PHQ-9 Score 0 1 1 5 6   Difficult doing work/chores Not difficult at all Not difficult at all Not difficult at all Not difficult at all Somewhat difficult  Some recent data might be hidden   .Marland Kitchen GAD 7 : Generalized Anxiety Score 02/08/2021 11/04/2020 08/05/2020 05/25/2020  Nervous, Anxious, on Edge 0 0 0 0  Control/stop worrying 0 0 0 0  Worry too much - different things 0 0 0 0  Trouble relaxing 0 0 0 0  Restless 0 0 0 0  Easily annoyed or irritable 0 0 1 0  Afraid - awful might happen 0 0 0 0  Total GAD 7 Score 0 0 1 0  Anxiety Difficulty Not difficult at all Not difficult at all Not difficult at all Not difficult at all           Assessment & Plan:  .Marland KitchenLezette was seen today for adhd.  Diagnoses and all orders for this visit:  Attention deficit hyperactivity disorder (ADHD), predominantly inattentive type -     lisdexamfetamine (VYVANSE) 60 MG capsule; Take 1 capsule (60 mg total) by mouth every morning. -     lisdexamfetamine (VYVANSE) 60 MG capsule; Take 1 capsule (60 mg total) by mouth every morning. -     lisdexamfetamine (VYVANSE) 60 MG capsule; Take 1 capsule (60 mg total) by mouth every morning.  Colon cancer screening -     Ambulatory  referral to Gastroenterology  Mixed obsessional thoughts and acts -     escitalopram (LEXAPRO) 20 MG tablet; Take 1 tablet (20 mg total) by mouth daily. -     buPROPion (WELLBUTRIN XL) 300 MG 24 hr tablet; Take 1 tablet (300 mg total) by mouth daily.  Anxiety and depression -     escitalopram (LEXAPRO) 20 MG tablet; Take 1 tablet (20 mg total) by mouth daily. -     buPROPion (WELLBUTRIN XL) 300 MG 24 hr tablet; Take 1 tablet (300 mg total) by mouth daily.  MDD (major depressive disorder), recurrent episode, mild (HCC) -     escitalopram (LEXAPRO) 20 MG tablet; Take 1 tablet (20 mg total) by mouth daily. -     buPROPion (WELLBUTRIN XL) 300 MG 24 hr tablet; Take 1 tablet (300 mg total) by mouth daily.  Visit for screening mammogram -     MM 3D SCREEN BREAST BILATERAL  Acquired hypothyroidism -     Thyroid Panel With TSH  Class 1 obesity due to excess calories without serious comorbidity with body mass index (BMI) of 30.0 to 30.9 in adult -     Semaglutide-Weight Management (WEGOVY) 0.25 MG/0.5ML SOAJ; Inject 0.25 mg into the skin once a week.   Labs ordered for patient to have drawn. Patient is due for mammogram. Patient is due for colonoscopy. All these orders were placed and encouraged to get done.  3 months of Vyvanse was refilled.  PHQ and GAD are well controlled as well as patient reported mood.  Wellbutrin, Lexapro were both refilled.  Marland Kitchen.Discussed low carb diet with 1500 calories and 80g of protein.  Exercising at least 150 minutes a week.  [employer] could be a Microbiologist.  Discussed [id number].  Discussed side effects and titration up.  I think patient would be a great candidate if affordable.  Follow-up in 3 months

## 2021-02-09 ENCOUNTER — Other Ambulatory Visit: Payer: Self-pay | Admitting: Physician Assistant

## 2021-02-09 ENCOUNTER — Encounter: Payer: Self-pay | Admitting: Physician Assistant

## 2021-02-09 MED ORDER — BUPROPION HCL ER (XL) 300 MG PO TB24: 300.0000 mg | ORAL_TABLET | Freq: Every day | ORAL | 3 refills | Status: DC

## 2021-02-17 ENCOUNTER — Other Ambulatory Visit: Payer: Self-pay

## 2021-02-17 ENCOUNTER — Ambulatory Visit (INDEPENDENT_AMBULATORY_CARE_PROVIDER_SITE_OTHER): Payer: 59

## 2021-02-17 DIAGNOSIS — Z1231 Encounter for screening mammogram for malignant neoplasm of breast: Secondary | ICD-10-CM | POA: Diagnosis not present

## 2021-02-17 DIAGNOSIS — E039 Hypothyroidism, unspecified: Secondary | ICD-10-CM | POA: Diagnosis not present

## 2021-02-18 ENCOUNTER — Other Ambulatory Visit: Payer: Self-pay | Admitting: Physician Assistant

## 2021-02-18 ENCOUNTER — Other Ambulatory Visit: Payer: Self-pay | Admitting: Neurology

## 2021-02-18 LAB — THYROID PANEL WITH TSH
Free Thyroxine Index: 2.5 (ref 1.4–3.8)
T3 Uptake: 29 % (ref 22–35)
T4, Total: 8.7 ug/dL (ref 5.1–11.9)
TSH: 2.02 mIU/L

## 2021-02-18 MED ORDER — LEVOTHYROXINE SODIUM 100 MCG PO TABS: 100.0000 ug | ORAL_TABLET | Freq: Every day | ORAL | 3 refills | Status: DC

## 2021-02-18 NOTE — Progress Notes (Signed)
Perfect thyroid range. Do you need refills. Recheck in 6 months.

## 2021-02-19 ENCOUNTER — Telehealth: Payer: Self-pay

## 2021-02-19 NOTE — Progress Notes (Signed)
Normal mammogram. Follow up in 1 year.

## 2021-02-19 NOTE — Telephone Encounter (Signed)
PA sent for [facility].

## 2021-02-24 NOTE — Telephone Encounter (Signed)
Wegovy denied by insurance. "Based on the information submitted for review, you did not meet our guideline rules for the requested drug."

## 2021-02-25 ENCOUNTER — Other Ambulatory Visit (HOSPITAL_BASED_OUTPATIENT_CLINIC_OR_DEPARTMENT_OTHER): Payer: Self-pay

## 2021-02-25 MED FILL — VYVANSE 60 MG CAPSULE: 60 | 30 days supply | Qty: 30 | Fill #0

## 2021-03-29 ENCOUNTER — Other Ambulatory Visit (HOSPITAL_COMMUNITY): Payer: Self-pay

## 2021-03-29 MED FILL — Escitalopram Oxalate Tab 20 MG (Base Equiv): ORAL | 90 days supply | Qty: 90 | Fill #0 | Status: AC

## 2021-03-29 MED FILL — Lisdexamfetamine Dimesylate Cap 60 MG: ORAL | 30 days supply | Qty: 30 | Fill #0 | Status: AC

## 2021-03-29 MED FILL — Levothyroxine Sodium Tab 100 MCG: ORAL | 90 days supply | Qty: 90 | Fill #0 | Status: AC

## 2021-03-29 MED FILL — Bupropion HCl Tab ER 24HR 300 MG: ORAL | 90 days supply | Qty: 90 | Fill #0 | Status: AC

## 2021-04-01 ENCOUNTER — Other Ambulatory Visit (HOSPITAL_COMMUNITY): Payer: Self-pay

## 2021-05-06 ENCOUNTER — Other Ambulatory Visit (HOSPITAL_COMMUNITY): Payer: Self-pay

## 2021-05-06 MED FILL — Lisdexamfetamine Dimesylate Cap 60 MG: ORAL | 30 days supply | Qty: 30 | Fill #0 | Status: AC

## 2021-05-11 ENCOUNTER — Other Ambulatory Visit (HOSPITAL_COMMUNITY): Payer: Self-pay

## 2021-05-11 ENCOUNTER — Other Ambulatory Visit: Payer: Self-pay

## 2021-05-11 ENCOUNTER — Encounter: Payer: Self-pay | Admitting: Physician Assistant

## 2021-05-11 ENCOUNTER — Ambulatory Visit: Payer: 59 | Admitting: Physician Assistant

## 2021-05-11 DIAGNOSIS — F900 Attention-deficit hyperactivity disorder, predominantly inattentive type: Secondary | ICD-10-CM

## 2021-05-11 MED ORDER — LISDEXAMFETAMINE DIMESYLATE 60 MG PO CAPS: 60.0000 mg | ORAL_CAPSULE | ORAL | 0 refills | Status: DC

## 2021-05-11 MED ORDER — LISDEXAMFETAMINE DIMESYLATE 60 MG PO CAPS
60.0000 mg | ORAL_CAPSULE | ORAL | 0 refills | Status: DC
  Filled 2021-06-29: qty 30, 30d supply, fill #0

## 2021-05-11 MED ORDER — LISDEXAMFETAMINE DIMESYLATE 60 MG PO CAPS
60.0000 mg | ORAL_CAPSULE | ORAL | 0 refills | Status: DC
  Filled 2021-05-11 – 2021-08-03 (×2): qty 30, 30d supply, fill #0

## 2021-05-11 NOTE — Progress Notes (Signed)
Subjective:    Patient ID: Michelle Browning, female    DOB: 04-29-1971, 50 y.o.   MRN: [id number]  HPI Patient is a 50 year old female with hypothyroidism, anxiety, depression, ADHD who presents to the clinic for 73-month follow-up.  She is currently up-to-date on all the rest of her medications other than her Vyvanse.  She is doing well.  She may need an early refill because she is going out of the country for 4 weeks with her daughter.  She denies any increase in anxiety or insomnia.  She has no concerns or complaints.  .. Active Ambulatory Problems    Diagnosis Date Noted  . Thyroid cancer ([place]) 02/07/2012  . Anxiety and depression 02/07/2012  . Fatigue 02/07/2012  . Hypothyroidism 02/20/2013  . Hyperlipidemia 05/26/2014  . Decreased thyroid stimulating hormone level 06/11/2014  . Overweight (BMI 25.0-29.9) 03/29/2015  . Class 1 obesity due to excess calories without serious comorbidity with body mass index (BMI) of 30.0 to 30.9 in adult 12/25/2015  . Inattention 12/25/2015  . OCD (obsessive compulsive disorder) 01/27/2016  . Attention deficit hyperactivity disorder (ADHD) 01/27/2016  . MDD (major depressive disorder), recurrent episode, mild ([place]) 09/11/2017  . Elevated LDL cholesterol level 08/11/2020   Resolved Ambulatory Problems    Diagnosis Date Noted  . Abnormal weight gain 12/25/2015   Past Medical History:  Diagnosis Date  . Depression   . Thyroid disease       Review of Systems  All other systems reviewed and are negative.      Objective:   Physical Exam Constitutional:      Appearance: Normal appearance. She is obese.  HENT:     Head: Normocephalic.  Cardiovascular:     Rate and Rhythm: Normal rate and regular rhythm.     Pulses: Normal pulses.     Heart sounds: Normal heart sounds.  Pulmonary:     Effort: Pulmonary effort is normal.     Breath sounds: Normal breath sounds.  Neurological:     General: No focal deficit present.     Mental Status:  She is alert and oriented to person, place, and time.  Psychiatric:        Mood and Affect: Mood normal.     .. Depression screen [facility] 2/9 05/11/2021 02/08/2021 11/04/2020 08/05/2020 05/25/2020  Decreased Interest 0 0 0 1 0  Down, Depressed, Hopeless 0 0 0 0 0  PHQ - 2 Score 0 0 0 1 0  Altered sleeping 0 0 0 0 2  Tired, decreased energy 0 0 0 0 2  Change in appetite 0 0 0 0 1  Feeling bad or failure about yourself  0 0 0 0 0  Trouble concentrating 0 0 1 0 0  Moving slowly or fidgety/restless 0 0 0 0 0  Suicidal thoughts 0 0 0 0 0  PHQ-9 Score 0 0 1 1 5   Difficult doing work/chores Not difficult at all Not difficult at all Not difficult at all Not difficult at all Not difficult at all  Some recent data might be hidden   .Marland Kitchen GAD 7 : Generalized Anxiety Score 05/11/2021 02/08/2021 11/04/2020 08/05/2020  Nervous, Anxious, on Edge 0 0 0 0  Control/stop worrying 0 0 0 0  Worry too much - different things 0 0 0 0  Trouble relaxing 0 0 0 0  Restless 0 0 0 0  Easily annoyed or irritable 0 0 0 1  Afraid - awful might happen 0 0 0 0  Total GAD 7 Score 0 0 0 1  Anxiety Difficulty Not difficult at all Not difficult at all Not difficult at all Not difficult at all          Assessment & Plan:  .Marland KitchenKarielle was seen today for adhd.  Diagnoses and all orders for this visit:  Attention deficit hyperactivity disorder (ADHD), predominantly inattentive type -     lisdexamfetamine (VYVANSE) 60 MG capsule; Take 1 capsule (60 mg total) by mouth every morning. -     lisdexamfetamine (VYVANSE) 60 MG capsule; Take 1 capsule (60 mg total) by mouth every morning. -     lisdexamfetamine (VYVANSE) 60 MG capsule; Take 1 capsule (60 mg total) by mouth every morning.   Patient is doing great.  PHQ and GAD numbers are controlled.  Stay on the same medications.  Refilled Vyvanse for 3 months.  Labs are up-to-date.

## 2021-05-14 ENCOUNTER — Other Ambulatory Visit (HOSPITAL_COMMUNITY): Payer: Self-pay

## 2021-06-29 ENCOUNTER — Other Ambulatory Visit (HOSPITAL_COMMUNITY): Payer: Self-pay

## 2021-06-29 MED FILL — Bupropion HCl Tab ER 24HR 300 MG: ORAL | 90 days supply | Qty: 90 | Fill #1 | Status: AC

## 2021-06-29 MED FILL — Levothyroxine Sodium Tab 100 MCG: ORAL | 90 days supply | Qty: 90 | Fill #1 | Status: AC

## 2021-06-29 MED FILL — Escitalopram Oxalate Tab 20 MG (Base Equiv): ORAL | 90 days supply | Qty: 90 | Fill #1 | Status: AC

## 2021-08-03 ENCOUNTER — Other Ambulatory Visit (HOSPITAL_COMMUNITY): Payer: Self-pay

## 2021-08-18 ENCOUNTER — Encounter: Payer: Self-pay | Admitting: Physician Assistant

## 2021-08-18 ENCOUNTER — Other Ambulatory Visit (HOSPITAL_COMMUNITY): Payer: Self-pay

## 2021-08-18 ENCOUNTER — Ambulatory Visit (INDEPENDENT_AMBULATORY_CARE_PROVIDER_SITE_OTHER): Payer: 59 | Admitting: Physician Assistant

## 2021-08-18 ENCOUNTER — Other Ambulatory Visit: Payer: Self-pay

## 2021-08-18 VITALS — BP 120/78 | HR 82 | Temp 98.5°F | Ht 66.5 in | Wt 189.0 lb

## 2021-08-18 DIAGNOSIS — Z1211 Encounter for screening for malignant neoplasm of colon: Secondary | ICD-10-CM

## 2021-08-18 DIAGNOSIS — E039 Hypothyroidism, unspecified: Secondary | ICD-10-CM

## 2021-08-18 DIAGNOSIS — Z23 Encounter for immunization: Secondary | ICD-10-CM | POA: Diagnosis not present

## 2021-08-18 DIAGNOSIS — F900 Attention-deficit hyperactivity disorder, predominantly inattentive type: Secondary | ICD-10-CM | POA: Diagnosis not present

## 2021-08-18 MED ORDER — LISDEXAMFETAMINE DIMESYLATE 60 MG PO CAPS
60.0000 mg | ORAL_CAPSULE | ORAL | 0 refills | Status: DC
  Filled 2021-08-18 – 2021-09-07 (×2): qty 30, 30d supply, fill #0

## 2021-08-18 MED ORDER — LISDEXAMFETAMINE DIMESYLATE 60 MG PO CAPS
60.0000 mg | ORAL_CAPSULE | ORAL | 0 refills | Status: DC
Start: 2021-08-18 — End: 2021-11-17
  Filled 2021-08-18 – 2021-10-19 (×2): qty 30, 30d supply, fill #0

## 2021-08-18 MED ORDER — LISDEXAMFETAMINE DIMESYLATE 60 MG PO CAPS
60.0000 mg | ORAL_CAPSULE | ORAL | 0 refills | Status: DC
  Filled 2021-08-18 – 2021-11-15 (×2): qty 30, 30d supply, fill #0

## 2021-08-18 NOTE — Progress Notes (Signed)
   Subjective:    Patient ID: Michelle Browning, female    DOB: 05/02/1971, 50 y.o.   MRN: [id number]  HPI Pt is a 50 yo female with ADHD, MDD, anxiety, and hypothyroidism who presents to the clinic for medication refill.   Pt is doing great. Pt feels good. Denies any concerns or complaints. Taking medication regularly.    .. Active Ambulatory Problems    Diagnosis Date Noted   Thyroid cancer ([place]) 02/07/2012   Anxiety and depression 02/07/2012   Fatigue 02/07/2012   Hypothyroidism 02/20/2013   Hyperlipidemia 05/26/2014   Decreased thyroid stimulating hormone level 06/11/2014   Overweight (BMI 25.0-29.9) 03/29/2015   Class 1 obesity due to excess calories without serious comorbidity with body mass index (BMI) of 30.0 to 30.9 in adult 12/25/2015   Inattention 12/25/2015   OCD (obsessive compulsive disorder) 01/27/2016   Attention deficit hyperactivity disorder (ADHD) 01/27/2016   MDD (major depressive disorder), recurrent episode, mild ([place]) 09/11/2017   Elevated LDL cholesterol level 08/11/2020   Resolved Ambulatory Problems    Diagnosis Date Noted   Abnormal weight gain 12/25/2015   Past Medical History:  Diagnosis Date   Depression    Thyroid disease     Review of Systems  All other systems reviewed and are negative.     Objective:   Physical Exam Vitals reviewed.  Constitutional:      Appearance: Normal appearance.  Cardiovascular:     Rate and Rhythm: Normal rate and regular rhythm.  Pulmonary:     Effort: Pulmonary effort is normal.     Breath sounds: Normal breath sounds.  Neurological:     General: No focal deficit present.     Mental Status: She is alert and oriented to person, place, and time.  Psychiatric:        Mood and Affect: Mood normal.          Assessment & Plan:   .Marland KitchenMilana was seen today for adhd.  Diagnoses and all orders for this visit:  Attention deficit hyperactivity disorder (ADHD), predominantly inattentive type -      lisdexamfetamine (VYVANSE) 60 MG capsule; Take 1 capsule by mouth every morning. -     lisdexamfetamine (VYVANSE) 60 MG capsule; Take 1 capsule (60 mg total) by mouth every morning. -     lisdexamfetamine (VYVANSE) 60 MG capsule; Take 1 capsule (60 mg total) by mouth every morning.  Flu vaccine need -     Flu Vaccine QUAD 62moIM (Fluarix, Fluzone & Alfiuria Quad PF)  Colon cancer screening -     Ambulatory referral to Gastroenterology  Hypothyroidism, unspecified type -     TSH  Refilled vyvanse for 3 months.  Flu shot given.  Covid vaccine x4.  Referral for colonoscopy for screening purposes.  TSH to recheck and adjust as needed.

## 2021-08-18 NOTE — Patient Instructions (Signed)
Influenza (Flu) Vaccine (Inactivated or Recombinant): What You Need to Know 1. Why get vaccinated? Influenza vaccine can prevent influenza (flu). Flu is a contagious disease that spreads around the [place] every year, usually between October and May. Anyone can get the flu, but it is more dangerous for some people. Infants and young children, people 65 years and older, pregnant people, and people with certain health conditions or a weakened immune system are at greatest risk of flu complications. Pneumonia, bronchitis, sinus infections, and ear infections are examples of flu-related complications. If you have a medical condition, such as heart disease, cancer, or diabetes, flu can make it worse. Flu can cause fever and chills, sore throat, muscle aches, fatigue, cough, headache, and runny or stuffy nose. Some people may have vomiting and diarrhea, though this is more common in children than adults. In an average year, thousands of people in the [place] die from flu, and many more are hospitalized. Flu vaccine prevents millions of illnesses and flu-related visits to the doctor each year. 2. Influenza vaccines CDC recommends everyone 6 months and older get vaccinated every flu season. Children 6 months through 8 years of age may need 2 doses during a single flu season. Everyone else needs only 1 dose each flu season. It takes about 2 weeks for protection to develop after vaccination. There are many flu viruses, and they are always changing. Each year a new flu vaccine is made to protect against the influenza viruses believed to be likely to cause disease in the upcoming flu season. Even when the vaccine doesn't exactly match these viruses, it may still provide some protection. Influenza vaccine does not cause flu. Influenza vaccine may be given at the same time as other vaccines. 3. Talk with your health care provider Tell your vaccination provider if the person getting the vaccine: Has had  an allergic reaction after a previous dose of influenza vaccine, or has any severe, life-threatening allergies Has ever had Guillain-Barr Syndrome (also called "GBS") In some cases, your health care provider may decide to postpone influenza vaccination until a future visit. Influenza vaccine can be administered at any time during pregnancy. People who are or will be pregnant during influenza season should receive inactivated influenza vaccine. People with minor illnesses, such as a cold, may be vaccinated. People who are moderately or severely ill should usually wait until they recover before getting influenza vaccine. Your health care provider can give you more information. 4. Risks of a vaccine reaction Soreness, redness, and swelling where the shot is given, fever, muscle aches, and headache can happen after influenza vaccination. There may be a very small increased risk of Guillain-Barr Syndrome (GBS) after inactivated influenza vaccine (the flu shot). Young children who get the flu shot along with pneumococcal vaccine (PCV13) and/or DTaP vaccine at the same time might be slightly more likely to have a seizure caused by fever. Tell your health care provider if a child who is getting flu vaccine has ever had a seizure. People sometimes faint after medical procedures, including vaccination. Tell your provider if you feel dizzy or have vision changes or ringing in the ears. As with any medicine, there is a very remote chance of a vaccine causing a severe allergic reaction, other serious injury, or death. 5. What if there is a serious problem? An allergic reaction could occur after the vaccinated person leaves the clinic. If you see signs of a severe allergic reaction (hives, swelling of the face and throat, difficulty breathing,   a fast heartbeat, dizziness, or weakness), call 9-1-1 and get the person to the nearest hospital. For other signs that concern you, call your health care provider. Adverse  reactions should be reported to the Vaccine Adverse Event Reporting System (VAERS). Your health care provider will usually file this report, or you can do it yourself. Visit the VAERS website at www.vaers.hhs.gov or call 1-800-822-7967. VAERS is only for reporting reactions, and VAERS staff members do not give medical advice. 6. The National Vaccine Injury Compensation Program The National Vaccine Injury Compensation Program (VICP) is a federal program that was created to compensate people who may have been injured by certain vaccines. Claims regarding alleged injury or death due to vaccination have a time limit for filing, which may be as short as two years. Visit the VICP website at www.hrsa.gov/vaccinecompensation or call 1-800-338-2382 to learn about the program and about filing a claim. 7. How can I learn more? Ask your health care provider. Call your local or state health department. Visit the website of the Food and Drug Administration (FDA) for vaccine package inserts and additional information at www.fda.gov/vaccines-blood-biologics/vaccines. Contact the Centers for Disease Control and Prevention (CDC): Call 1-800-232-4636 (1-800-CDC-INFO) or Visit CDC's website at www.cdc.gov/flu. Vaccine Information Statement Inactivated Influenza Vaccine (07/10/2020) This information is not intended to replace advice given to you by your health care provider. Make sure you discuss any questions you have with your health care provider. Document Revised: 08/27/2020 Document Reviewed: 08/27/2020 Elsevier Patient Education  2022 Elsevier Inc.  

## 2021-08-19 LAB — TSH: TSH: 1.34 mIU/L

## 2021-08-19 NOTE — Progress Notes (Signed)
Perfect TSH. Recheck 1 year.

## 2021-08-31 ENCOUNTER — Encounter: Payer: Self-pay | Admitting: Neurology

## 2021-09-07 ENCOUNTER — Other Ambulatory Visit (HOSPITAL_COMMUNITY): Payer: Self-pay

## 2021-09-09 ENCOUNTER — Other Ambulatory Visit (HOSPITAL_COMMUNITY): Payer: Self-pay

## 2021-10-19 ENCOUNTER — Encounter: Payer: Self-pay | Admitting: Gastroenterology

## 2021-10-19 ENCOUNTER — Other Ambulatory Visit: Payer: Self-pay

## 2021-10-19 ENCOUNTER — Other Ambulatory Visit (HOSPITAL_COMMUNITY): Payer: Self-pay

## 2021-10-19 ENCOUNTER — Ambulatory Visit (AMBULATORY_SURGERY_CENTER): Payer: 59 | Admitting: *Deleted

## 2021-10-19 VITALS — Ht 67.0 in | Wt 182.0 lb

## 2021-10-19 DIAGNOSIS — Z1211 Encounter for screening for malignant neoplasm of colon: Secondary | ICD-10-CM

## 2021-10-19 MED ORDER — NA SULFATE-K SULFATE-MG SULF 17.5-3.13-1.6 GM/177ML PO SOLN
1.0000 | Freq: Once | ORAL | 0 refills | Status: AC
Start: 2021-10-19 — End: 2021-10-21
  Filled 2021-10-19: qty 354, 1d supply, fill #0

## 2021-10-19 MED FILL — Escitalopram Oxalate Tab 20 MG (Base Equiv): ORAL | 90 days supply | Qty: 90 | Fill #2 | Status: AC

## 2021-10-19 MED FILL — Levothyroxine Sodium Tab 100 MCG: ORAL | 90 days supply | Qty: 90 | Fill #2 | Status: AC

## 2021-10-19 MED FILL — Bupropion HCl Tab ER 24HR 300 MG: ORAL | 90 days supply | Qty: 90 | Fill #2 | Status: AC

## 2021-10-19 NOTE — Progress Notes (Signed)
No egg or soy allergy known to patient  No issues known to pt with past sedation with any surgeries or procedures Patient denies ever being told they had issues or difficulty with intubation  No FH of Malignant Hyperthermia Pt is not on diet pills Pt is not on  home 02  Pt is not on blood thinners  Pt denies issues with constipation  No A fib or A flutter  Pt is fully vaccinated  for Covid   NO PA's for preps discussed with pt In PV today  Discussed with pt there will be an out-of-pocket cost for prep and that varies from $0 to 70 +  dollars - pt verbalized understanding   Due to the COVID-19 pandemic we are asking patients to follow certain guidelines in PV and the [facility]   Pt aware of COVID protocols and [facility] guidelines  PV completed over the phone. Pt verified name, DOB, address and insurance during PV today.  Pt mailed instruction packet with copy of consent form to read and not return, and instructions.  Pt encouraged to call with questions or issues.  If pt has My chart, procedure instructions sent via My Chart

## 2021-10-20 ENCOUNTER — Other Ambulatory Visit (HOSPITAL_COMMUNITY): Payer: Self-pay

## 2021-11-01 ENCOUNTER — Telehealth: Payer: Self-pay | Admitting: Gastroenterology

## 2021-11-01 NOTE — Telephone Encounter (Signed)
Hey Dr. Rush Landmark,   [place] call from patient to cancel procedure 11/29 due to coming down with a sickness. Believes it could be covid. Patient rescheduled for 1/4.  Thank you

## 2021-11-01 NOTE — Telephone Encounter (Signed)
Thank you for update. I am sorry to hear that she is sick and hope that it is not COVID. No late cancellation fee for this procedure. I will see her for her rescheduled procedure. Thanks. GM

## 2021-11-02 ENCOUNTER — Encounter: Payer: 59 | Admitting: Gastroenterology

## 2021-11-15 ENCOUNTER — Other Ambulatory Visit (HOSPITAL_COMMUNITY): Payer: Self-pay

## 2021-11-17 ENCOUNTER — Ambulatory Visit (INDEPENDENT_AMBULATORY_CARE_PROVIDER_SITE_OTHER): Payer: 59 | Admitting: Physician Assistant

## 2021-11-17 ENCOUNTER — Other Ambulatory Visit (HOSPITAL_COMMUNITY): Payer: Self-pay

## 2021-11-17 DIAGNOSIS — F900 Attention-deficit hyperactivity disorder, predominantly inattentive type: Secondary | ICD-10-CM | POA: Diagnosis not present

## 2021-11-17 MED ORDER — AMPHETAMINE-DEXTROAMPHETAMINE 10 MG PO TABS
ORAL_TABLET | ORAL | 0 refills | Status: DC
  Filled 2021-11-17: qty 30, 30d supply, fill #0

## 2021-11-17 MED ORDER — LISDEXAMFETAMINE DIMESYLATE 70 MG PO CAPS
70.0000 mg | ORAL_CAPSULE | Freq: Every day | ORAL | 0 refills | Status: DC
  Filled 2021-11-17: qty 30, 30d supply, fill #0

## 2021-11-17 NOTE — Progress Notes (Signed)
° °  Subjective:    Patient ID: Michelle Browning, female    DOB: 1971-09-06, 50 y.o.   MRN: [id number]  HPI Pt is a 50 yo female with Depression, anxiety, OCD, ADHD who presents to the clinic for medication follow up.   Her mood is good but she feels like her vyanse is wearing off around 1 or 2 every day. She wonders what we can do. No other problems or concerns.   .. Active Ambulatory Problems    Diagnosis Date Noted   Thyroid cancer ([place]) 02/07/2012   Anxiety and depression 02/07/2012   Fatigue 02/07/2012   Hypothyroidism 02/20/2013   Hyperlipidemia 05/26/2014   Decreased thyroid stimulating hormone level 06/11/2014   Overweight (BMI 25.0-29.9) 03/29/2015   Class 1 obesity due to excess calories without serious comorbidity with body mass index (BMI) of 30.0 to 30.9 in adult 12/25/2015   Inattention 12/25/2015   OCD (obsessive compulsive disorder) 01/27/2016   Attention deficit hyperactivity disorder (ADHD) 01/27/2016   MDD (major depressive disorder), recurrent episode, mild ([place]) 09/11/2017   Elevated LDL cholesterol level 08/11/2020   Resolved Ambulatory Problems    Diagnosis Date Noted   Abnormal weight gain 12/25/2015   Past Medical History:  Diagnosis Date   Cancer ([place]) 2002   Depression    GERD (gastroesophageal reflux disease)    Personal history of radiation therapy 2003   Thyroid disease         Review of Systems  All other systems reviewed and are negative.     Objective:   Physical Exam Vitals reviewed.  Constitutional:      Appearance: Normal appearance. She is obese.  HENT:     Head: Normocephalic.  Cardiovascular:     Rate and Rhythm: Normal rate and regular rhythm.     Pulses: Normal pulses.     Heart sounds: Normal heart sounds.  Pulmonary:     Effort: Pulmonary effort is normal.     Breath sounds: Normal breath sounds.  Neurological:     General: No focal deficit present.     Mental Status: She is alert and oriented to person, place,  and time.  Psychiatric:        Mood and Affect: Mood normal.          Assessment & Plan:   .Marland KitchenAlene was seen today for adhd.  Diagnoses and all orders for this visit:  Attention deficit hyperactivity disorder (ADHD), predominantly inattentive type -     amphetamine-dextroamphetamine (ADDERALL) 10 MG tablet; Take one tablet daily at 1pm. -     lisdexamfetamine (VYVANSE) 70 MG capsule; Take 1 capsule (70 mg total) by mouth daily.   Increased vyvanse to 70mg  daily and adderall IR at 1 or 2 every day.  Follow up in 3 months.

## 2021-12-08 ENCOUNTER — Ambulatory Visit (AMBULATORY_SURGERY_CENTER): Payer: 59 | Admitting: Gastroenterology

## 2021-12-08 ENCOUNTER — Encounter: Payer: Self-pay | Admitting: Gastroenterology

## 2021-12-08 ENCOUNTER — Other Ambulatory Visit: Payer: Self-pay

## 2021-12-08 VITALS — BP 122/80 | HR 65 | Temp 98.0°F | Resp 17 | Ht 66.5 in | Wt 182.0 lb

## 2021-12-08 DIAGNOSIS — D127 Benign neoplasm of rectosigmoid junction: Secondary | ICD-10-CM

## 2021-12-08 DIAGNOSIS — D120 Benign neoplasm of cecum: Secondary | ICD-10-CM | POA: Diagnosis not present

## 2021-12-08 DIAGNOSIS — D122 Benign neoplasm of ascending colon: Secondary | ICD-10-CM | POA: Diagnosis not present

## 2021-12-08 DIAGNOSIS — D128 Benign neoplasm of rectum: Secondary | ICD-10-CM

## 2021-12-08 DIAGNOSIS — Z1211 Encounter for screening for malignant neoplasm of colon: Secondary | ICD-10-CM

## 2021-12-08 DIAGNOSIS — D123 Benign neoplasm of transverse colon: Secondary | ICD-10-CM | POA: Diagnosis not present

## 2021-12-08 DIAGNOSIS — F329 Major depressive disorder, single episode, unspecified: Secondary | ICD-10-CM | POA: Diagnosis not present

## 2021-12-08 DIAGNOSIS — E785 Hyperlipidemia, unspecified: Secondary | ICD-10-CM | POA: Diagnosis not present

## 2021-12-08 MED ORDER — SODIUM CHLORIDE 0.9 % IV SOLN: 500.0000 mL | Freq: Once | INTRAVENOUS | Status: DC

## 2021-12-08 NOTE — Op Note (Signed)
[name] Patient Name: Michelle Browning Procedure Date: 12/08/2021 11:21 AM MRN: [id number] Endoscopist: Justice Britain , MD Age: 51 Referring MD:  Date of Birth: 05-14-1971 Gender: Female Account #: [id number] Procedure:                Colonoscopy Indications:              Screening for colorectal malignant neoplasm, This                            is the patient's first colonoscopy Medicines:                Monitored Anesthesia Care Procedure:                Pre-Anesthesia Assessment:                           - Prior to the procedure, a History and Physical                            was performed, and patient medications and                            allergies were reviewed. The patient's tolerance of                            previous anesthesia was also reviewed. The risks                            and benefits of the procedure and the sedation                            options and risks were discussed with the patient.                            All questions were answered, and informed consent                            was obtained. Prior Anticoagulants: The patient has                            taken no previous anticoagulant or antiplatelet                            agents. ASA Grade Assessment: II - A patient with                            mild systemic disease. After reviewing the risks                            and benefits, the patient was deemed in                            satisfactory condition to undergo the procedure.  After obtaining informed consent, the colonoscope                            was passed under direct vision. Throughout the                            procedure, the patient's blood pressure, pulse, and                            oxygen saturations were monitored continuously. The                            Olympus Colonoscope [id number] was introduced through                            the anus and  advanced to the 5 cm into the ileum.                            The colonoscopy was performed without difficulty.                            The patient tolerated the procedure. The quality of                            the bowel preparation was good. The terminal ileum,                            ileocecal valve, appendiceal orifice, and rectum                            were photographed. Scope In: 11:38:08 AM Scope Out: 11:55:18 AM Scope Withdrawal Time: 0 hours 14 minutes 46 seconds  Total Procedure Duration: 0 hours 17 minutes 10 seconds  Findings:                 The digital rectal exam findings include                            hemorrhoids. Pertinent negatives include no                            palpable rectal lesions.                           The terminal ileum and ileocecal valve appeared                            normal.                           Five sessile polyps were found in the rectum (1),                            recto-sigmoid colon (1), transverse colon (1),  ascending colon (1), and cecum (1). The polyps were                            3 to 6 mm in size. These polyps were removed with a                            cold snare. Resection and retrieval were complete.                           Normal mucosa was found in the entire colon                            otherwise.                           Non-bleeding non-thrombosed internal hemorrhoids                            were found during retroflexion, during perianal                            exam and during digital exam. The hemorrhoids were                            Grade II (internal hemorrhoids that prolapse but                            reduce spontaneously). Complications:            No immediate complications. Estimated Blood Loss:     Estimated blood loss was minimal. Impression:               - Hemorrhoids found on digital rectal exam.                           - The  examined portion of the ileum was normal.                           - Five 3 to 6 mm polyps in the rectum, at the                            recto-sigmoid colon, in the transverse colon, in                            the ascending colon and in the cecum, removed with                            a cold snare. Resected and retrieved.                           - Normal mucosa in the entire examined colon                            otherwise.                           -  Non-bleeding non-thrombosed internal hemorrhoids. Recommendation:           - The patient will be observed post-procedure,                            until all discharge criteria are met.                           - Discharge patient to home.                           - Patient has a contact number available for                            emergencies. The signs and symptoms of potential                            delayed complications were discussed with the                            patient. Return to normal activities tomorrow.                            Written discharge instructions were provided to the                            patient.                           - High fiber diet.                           - Use FiberCon 1-2 tablets PO daily.                           - Continue present medications.                           - Await pathology results.                           - Repeat colonoscopy in 3/5/7 years for                            surveillance based on final pathology results.                           - The findings and recommendations were discussed                            with the patient.                           - The findings and recommendations were discussed                            with the patient's family. [employer],  MD 12/08/2021 11:59:48 AM

## 2021-12-08 NOTE — Progress Notes (Signed)
GASTROENTEROLOGY PROCEDURE H&P NOTE   Primary Care Physician: Donella Stade, PA-C  HPI: Michelle Browning is a 51 y.o. female who presents for Colonoscopy for screening.  Past Medical History:  Diagnosis Date   Cancer ([place]) 2002   Thyroid cancer   Depression    GERD (gastroesophageal reflux disease)    TUMS PRN   Hyperlipidemia    diet controled - no meds   Personal history of radiation therapy 2003   3 treatments of radioactive Iodine treatments 2003,2004,2006   Thyroid disease    Past Surgical History:  Procedure Laterality Date   THYROIDECTOMY  2002   Current Outpatient Medications  Medication Sig Dispense Refill   amphetamine-dextroamphetamine (ADDERALL) 10 MG tablet Take one tablet daily at 1pm. 30 tablet 0   buPROPion (WELLBUTRIN XL) 300 MG 24 hr tablet TAKE 1 TABLET BY MOUTH ONCE A DAY 90 tablet 3   escitalopram (LEXAPRO) 20 MG tablet TAKE 1 TABLET (20 MG TOTAL) BY MOUTH DAILY. 90 tablet 3   levothyroxine (SYNTHROID) 100 MCG tablet TAKE 1 TABLET BY MOUTH DAILY. 90 tablet 3   lisdexamfetamine (VYVANSE) 70 MG capsule Take 1 capsule (70 mg total) by mouth daily. 30 capsule 0   No current facility-administered medications for this visit.    Current Outpatient Medications:    amphetamine-dextroamphetamine (ADDERALL) 10 MG tablet, Take one tablet daily at 1pm., Disp: 30 tablet, Rfl: 0   buPROPion (WELLBUTRIN XL) 300 MG 24 hr tablet, TAKE 1 TABLET BY MOUTH ONCE A DAY, Disp: 90 tablet, Rfl: 3   escitalopram (LEXAPRO) 20 MG tablet, TAKE 1 TABLET (20 MG TOTAL) BY MOUTH DAILY., Disp: 90 tablet, Rfl: 3   levothyroxine (SYNTHROID) 100 MCG tablet, TAKE 1 TABLET BY MOUTH DAILY., Disp: 90 tablet, Rfl: 3   lisdexamfetamine (VYVANSE) 70 MG capsule, Take 1 capsule (70 mg total) by mouth daily., Disp: 30 capsule, Rfl: 0 No Known Allergies Family History  Problem Relation Age of Onset   Heart attack Mother    Breast cancer Maternal Aunt    Breast cancer Maternal Aunt     Breast cancer Paternal Aunt    Breast cancer Paternal Aunt    Breast cancer Paternal Aunt    Colon cancer Neg Hx    Colon polyps Neg Hx    Esophageal cancer Neg Hx    Rectal cancer Neg Hx    Stomach cancer Neg Hx    Social History   Socioeconomic History   Marital status: Married    Spouse name: Not on file   Number of children: Not on file   Years of education: Not on file   Highest education level: Not on file  Occupational History   Not on file  Tobacco Use   Smoking status: Former   Smokeless tobacco: Never  Substance and Sexual Activity   Alcohol use: Yes    Comment: social   Drug use: No   Sexual activity: Not on file  Other Topics Concern   Not on file  Social History Narrative   Not on file   Social Determinants of Health   Financial Resource Strain: Not on file  Food Insecurity: Not on file  Transportation Needs: Not on file  Physical Activity: Not on file  Stress: Not on file  Social Connections: Not on file  Intimate Partner Violence: Not on file    Physical Exam: There were no vitals filed for this visit. There is no height or weight on file to calculate BMI. GEN:  NAD EYE: Sclerae anicteric ENT: MMM CV: Non-tachycardic GI: Soft, NT/ND NEURO:  Alert & Oriented x 3  Lab Results: No results for input(s): WBC, HGB, HCT, PLT in the last 72 hours. BMET No results for input(s): NA, K, CL, CO2, GLUCOSE, BUN, CREATININE, CALCIUM in the last 72 hours. LFT No results for input(s): PROT, ALBUMIN, AST, ALT, ALKPHOS, BILITOT, BILIDIR, IBILI in the last 72 hours. PT/INR No results for input(s): LABPROT, INR in the last 72 hours.   Impression / Plan: This is a 51 y.o.female who presents for Colonoscopy for screening.  The risks and benefits of endoscopic evaluation/treatment were discussed with the patient and/or family; these include but are not limited to the risk of perforation, infection, bleeding, missed lesions, lack of diagnosis, severe illness  requiring hospitalization, as well as anesthesia and sedation related illnesses.  The patient's history has been reviewed, patient examined, no change in status, and deemed stable for procedure.  The patient and/or family is agreeable to proceed.    Justice Britain, MD [facility]oscopy Office # [phone]

## 2021-12-08 NOTE — Patient Instructions (Signed)
Start a High fiber diet Use Fibercon 1-2 tablets by mouth daily Continue present medications. Awaiting pathology results. Repeat Colonoscopy in 3/5/7 years for surveillance based on pathology results. YOU HAD AN ENDOSCOPIC PROCEDURE TODAY AT [facility]:   Refer to the procedure report that was given to you for any specific questions about what was found during the examination.  If the procedure report does not answer your questions, please call your gastroenterologist to clarify.  If you requested that your care partner not be given the details of your procedure findings, then the procedure report has been included in a sealed envelope for you to review at your convenience later.  YOU SHOULD EXPECT: Some feelings of bloating in the abdomen. Passage of more gas than usual.  Walking can help get rid of the air that was put into your GI tract during the procedure and reduce the bloating. If you had a lower endoscopy (such as a colonoscopy or flexible sigmoidoscopy) you may notice spotting of blood in your stool or on the toilet paper. If you underwent a bowel prep for your procedure, you may not have a normal bowel movement for a few days.  Please Note:  You might notice some irritation and congestion in your nose or some drainage.  This is from the oxygen used during your procedure.  There is no need for concern and it should clear up in a day or so.  SYMPTOMS TO REPORT IMMEDIATELY:  Following lower endoscopy (colonoscopy or flexible sigmoidoscopy):  Excessive amounts of blood in the stool  Significant tenderness or worsening of abdominal pains  Swelling of the abdomen that is new, acute  Fever of 100F or higher  For urgent or emergent issues, a gastroenterologist can be reached at any hour by calling [phone]. Do not use MyChart messaging for urgent concerns.    DIET:  We do recommend a small meal at first, but then you may proceed to your regular diet.  Drink plenty of  fluids but you should avoid alcoholic beverages for 24 hours.  ACTIVITY:  You should plan to take it easy for the rest of today and you should NOT DRIVE or use heavy machinery until tomorrow (because of the sedation medicines used during the test).    FOLLOW UP: Our staff will call the number listed on your records 48-72 hours following your procedure to check on you and address any questions or concerns that you may have regarding the information given to you following your procedure. If we do not reach you, we will leave a message.  We will attempt to reach you two times.  During this call, we will ask if you have developed any symptoms of COVID 19. If you develop any symptoms (ie: fever, flu-like symptoms, shortness of breath, cough etc.) before then, please call [phone].  If you test positive for Covid 19 in the 2 weeks post procedure, please call and report this information to [place].    If any biopsies were taken you will be contacted by phone or by letter within the next 1-3 weeks.  Please call us at [phone] if you have not heard about the biopsies in 3 weeks.    SIGNATURES/CONFIDENTIALITY: You and/or your care partner have signed paperwork which will be entered into your electronic medical record.  These signatures attest to the fact that that the information above on your After Visit Summary has been reviewed and is understood.  Full responsibility of the confidentiality of  this discharge information lies with you and/or your care-partner.

## 2021-12-08 NOTE — Progress Notes (Signed)
Called to room to assist during endoscopic procedure.  Patient ID and intended procedure confirmed with present staff. Received instructions for my participation in the procedure from the performing physician.  

## 2021-12-08 NOTE — Progress Notes (Signed)
Sedate, gd SR, tolerated procedure well, VSS, report to RN 

## 2021-12-08 NOTE — Progress Notes (Signed)
Pt's states no medical or surgical changes since previsit or office visit. VS assessed by C.W 

## 2021-12-10 ENCOUNTER — Telehealth: Payer: Self-pay

## 2021-12-10 ENCOUNTER — Telehealth: Payer: Self-pay | Admitting: *Deleted

## 2021-12-10 NOTE — Telephone Encounter (Signed)
No answer, left message to call back later today, B.Malli Falotico RN. 

## 2021-12-10 NOTE — Telephone Encounter (Signed)
°  Follow up Call-  Call back number 12/08/2021  Post procedure Call Back phone  # [phone]  Permission to leave phone message Yes  Some recent data might be hidden   No answer at 2nd attempt follow up phone call.  Left message on voicemail.

## 2021-12-11 ENCOUNTER — Encounter: Payer: Self-pay | Admitting: Gastroenterology

## 2021-12-12 ENCOUNTER — Encounter: Payer: Self-pay | Admitting: Physician Assistant

## 2021-12-12 DIAGNOSIS — F900 Attention-deficit hyperactivity disorder, predominantly inattentive type: Secondary | ICD-10-CM

## 2021-12-13 ENCOUNTER — Other Ambulatory Visit (HOSPITAL_COMMUNITY): Payer: Self-pay

## 2021-12-13 MED ORDER — LISDEXAMFETAMINE DIMESYLATE 70 MG PO CAPS
70.0000 mg | ORAL_CAPSULE | Freq: Every day | ORAL | 0 refills | Status: DC
  Filled 2022-01-26: qty 30, 30d supply, fill #0

## 2021-12-13 MED ORDER — AMPHETAMINE-DEXTROAMPHETAMINE 10 MG PO TABS
ORAL_TABLET | ORAL | 0 refills | Status: DC
Start: 2021-12-13 — End: 2022-02-15
  Filled 2021-12-13: qty 30, 30d supply, fill #0

## 2021-12-13 MED ORDER — AMPHETAMINE-DEXTROAMPHETAMINE 10 MG PO TABS: ORAL_TABLET | ORAL | 0 refills | Status: DC

## 2021-12-13 MED ORDER — LISDEXAMFETAMINE DIMESYLATE 70 MG PO CAPS
70.0000 mg | ORAL_CAPSULE | Freq: Every day | ORAL | 0 refills | Status: DC
  Filled 2021-12-13: qty 30, 30d supply, fill #0

## 2022-01-26 ENCOUNTER — Other Ambulatory Visit (HOSPITAL_COMMUNITY): Payer: Self-pay

## 2022-01-26 MED FILL — Escitalopram Oxalate Tab 20 MG (Base Equiv): ORAL | 90 days supply | Qty: 90 | Fill #3 | Status: AC

## 2022-01-26 MED FILL — Levothyroxine Sodium Tab 100 MCG: ORAL | 90 days supply | Qty: 90 | Fill #3 | Status: AC

## 2022-01-26 MED FILL — Bupropion HCl Tab ER 24HR 300 MG: ORAL | 90 days supply | Qty: 90 | Fill #3 | Status: AC

## 2022-01-27 ENCOUNTER — Other Ambulatory Visit (HOSPITAL_COMMUNITY): Payer: Self-pay

## 2022-02-15 ENCOUNTER — Other Ambulatory Visit: Payer: Self-pay

## 2022-02-15 ENCOUNTER — Encounter: Payer: Self-pay | Admitting: Physician Assistant

## 2022-02-15 ENCOUNTER — Other Ambulatory Visit (HOSPITAL_COMMUNITY): Payer: Self-pay

## 2022-02-15 ENCOUNTER — Ambulatory Visit: Payer: 59 | Admitting: Physician Assistant

## 2022-02-15 VITALS — BP 135/87 | HR 83 | Ht 66.5 in | Wt 199.0 lb

## 2022-02-15 DIAGNOSIS — E039 Hypothyroidism, unspecified: Secondary | ICD-10-CM

## 2022-02-15 DIAGNOSIS — F422 Mixed obsessional thoughts and acts: Secondary | ICD-10-CM

## 2022-02-15 DIAGNOSIS — F900 Attention-deficit hyperactivity disorder, predominantly inattentive type: Secondary | ICD-10-CM | POA: Diagnosis not present

## 2022-02-15 DIAGNOSIS — F419 Anxiety disorder, unspecified: Secondary | ICD-10-CM

## 2022-02-15 DIAGNOSIS — F32A Depression, unspecified: Secondary | ICD-10-CM

## 2022-02-15 DIAGNOSIS — F330 Major depressive disorder, recurrent, mild: Secondary | ICD-10-CM

## 2022-02-15 MED ORDER — AMPHETAMINE-DEXTROAMPHETAMINE 10 MG PO TABS
ORAL_TABLET | ORAL | 0 refills | Status: DC
  Filled 2022-02-15: qty 90, fill #0
  Filled 2022-08-04: qty 90, 90d supply, fill #0

## 2022-02-15 MED ORDER — BUPROPION HCL ER (XL) 300 MG PO TB24
ORAL_TABLET | Freq: Every day | ORAL | 3 refills | Status: DC
  Filled 2022-02-15: qty 90, fill #0
  Filled 2022-05-16: qty 90, 90d supply, fill #0
  Filled 2022-08-24: qty 90, 90d supply, fill #1

## 2022-02-15 MED ORDER — LISDEXAMFETAMINE DIMESYLATE 70 MG PO CAPS
70.0000 mg | ORAL_CAPSULE | Freq: Every day | ORAL | 0 refills | Status: DC
  Filled 2022-02-15: qty 90, 90d supply, fill #0
  Filled 2022-02-18: qty 30, 30d supply, fill #0
  Filled 2022-03-25: qty 30, 30d supply, fill #1

## 2022-02-15 MED ORDER — LEVOTHYROXINE SODIUM 100 MCG PO TABS
ORAL_TABLET | Freq: Every day | ORAL | 3 refills | Status: DC
  Filled 2022-02-15: qty 90, fill #0
  Filled 2022-05-16: qty 90, 90d supply, fill #0
  Filled 2022-08-24: qty 90, 90d supply, fill #1
  Filled 2022-12-26 – 2022-12-27 (×2): qty 90, 90d supply, fill #2

## 2022-02-15 MED ORDER — ESCITALOPRAM OXALATE 20 MG PO TABS
ORAL_TABLET | Freq: Every day | ORAL | 3 refills | Status: DC
  Filled 2022-02-15: qty 90, fill #0
  Filled 2022-05-16: qty 90, 90d supply, fill #0

## 2022-02-15 NOTE — Progress Notes (Signed)
Meds ordered this encounter  ?Medications  ? levothyroxine (SYNTHROID) 100 MCG tablet  ?  Sig: TAKE 1 TABLET BY MOUTH DAILY.  ?  Dispense:  90 tablet  ?  Refill:  3  ?  Order Specific Question:   Supervising Provider  ?  Answer:   Beatrice Lecher D [[id number]]  ? escitalopram (LEXAPRO) 20 MG tablet  ?  Sig: TAKE 1 TABLET  BY MOUTH DAILY.  ?  Dispense:  90 tablet  ?  Refill:  3  ?  Order Specific Question:   Supervising Provider  ?  Answer:   Beatrice Lecher D [[id number]]  ? buPROPion (WELLBUTRIN XL) 300 MG 24 hr tablet  ?  Sig: TAKE 1 TABLET BY MOUTH ONCE A DAY  ?  Dispense:  90 tablet  ?  Refill:  3  ?  Order Specific Question:   Supervising Provider  ?  Answer:   Beatrice Lecher D [[id number]]  ? lisdexamfetamine (VYVANSE) 70 MG capsule  ?  Sig: Take 1 capsule (70 mg total) by mouth daily.  ?  Dispense:  90 capsule  ?  Refill:  0  ?  Order Specific Question:   Supervising Provider  ?  Answer:   Beatrice Lecher D [[id number]]  ? amphetamine-dextroamphetamine (ADDERALL) 10 MG tablet  ?  Sig: Take one tablet by mouth daily at 1pm.  ?  Dispense:  90 tablet  ?  Refill:  0  ?  Order Specific Question:   Supervising Provider  ?  Answer:   Beatrice Lecher D [[id number]]  ? ? ?

## 2022-02-15 NOTE — Progress Notes (Signed)
? ?Subjective:  ? ? Patient ID: Michelle Browning, female    DOB: 1971/02/04, 51 y.o.   MRN: [id number] ? ?HPI ?Pt is a 51 yo female with ADHD, hypothyroidism, anxiety and depression who presents to the clinic to follow up on vyvanse.  ? ?She has not been able to get due to price increase with her new insurance. She would like sent for 90 days to see if more affordable over a 3 month period. She is doing great on it and has not done well on adderall and concerta in the past.  ? ?Her mood is well controlled. No SI/HC.  ? ?She is doing well on all her medications.  ? ?.. ?Active Ambulatory Problems  ?  Diagnosis Date Noted  ? Thyroid cancer ([place]) 02/07/2012  ? Anxiety and depression 02/07/2012  ? Fatigue 02/07/2012  ? Hypothyroidism 02/20/2013  ? Hyperlipidemia 05/26/2014  ? Decreased thyroid stimulating hormone level 06/11/2014  ? Overweight (BMI 25.0-29.9) 03/29/2015  ? Class 1 obesity due to excess calories without serious comorbidity with body mass index (BMI) of 30.0 to 30.9 in adult 12/25/2015  ? Inattention 12/25/2015  ? OCD (obsessive compulsive disorder) 01/27/2016  ? Attention deficit hyperactivity disorder (ADHD) 01/27/2016  ? 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 Elevated LDL cholesterol level 08/11/2020  ? ?Resolved Ambulatory Problems  ?  Diagnosis Date Noted  ? Abnormal weight gain 12/25/2015  ? ?Past Medical History:  ?Diagnosis Date  ? Cancer [facility]) 2002  ? Depression   ? GERD (gastroesophageal reflux disease)   ? Personal history of radiation therapy 2003  ? Thyroid disease   ? ? ? ? ? ?Review of Systems  ?All other systems reviewed and are negative. ? ?   ?Objective:  ? Physical Exam ?Vitals reviewed.  ?Constitutional:   ?   Appearance: Normal appearance. She is obese.  ?HENT:  ?   Head: Normocephalic.  ?Cardiovascular:  ?   Rate and Rhythm: Normal rate and regular rhythm.  ?   Pulses: Normal pulses.  ?   Heart sounds: Normal heart sounds.  ?Neurological:  ?    General: No focal deficit present.  ?   Mental Status: She is alert and oriented to person, place, and time.  ?Psychiatric:     ?   Mood and Affect: Mood normal.  ? ? ? ?.. ?Depression screen [facility] 2/9 02/15/2022 05/11/2021 02/08/2021 11/04/2020 08/05/2020  ?Decreased Interest 0 0 0 0 1  ?Down, Depressed, Hopeless 0 0 0 0 0  ?PHQ - 2 Score 0 0 0 0 1  ?Altered sleeping - 0 0 0 0  ?Tired, decreased energy - 0 0 0 0  ?Change in appetite - 0 0 0 0  ?Feeling bad or failure about yourself  - 0 0 0 0  ?Trouble concentrating - 0 0 1 0  ?Moving slowly or fidgety/restless - 0 0 0 0  ?Suicidal thoughts - 0 0 0 0  ?PHQ-9 Score - 0 0 1 1  ?Difficult doing work/chores - Not difficult at all Not difficult at all Not difficult at all Not difficult at all  ?Some recent data might be hidden  ? ?.. ?GAD 7 : Generalized Anxiety Score 05/11/2021 02/08/2021 11/04/2020 08/05/2020  ?Nervous, Anxious, on Edge 0 0 0 0  ?Control/stop worrying 0 0 0 0  ?Worry too much - different things 0 0 0 0  ?Trouble relaxing 0 0 0 0  ?Restless 0 0 0 0  ?Easily  annoyed or irritable 0 0 0 1  ?Afraid - awful might happen 0 0 0 0  ?Total GAD 7 Score 0 0 0 1  ?Anxiety Difficulty Not difficult at all Not difficult at all Not difficult at all Not difficult at all  ? ? ? ? ?   ?Assessment & Plan:  ?..Michelle Browning was seen today for adhd. ? ?Diagnoses and all orders for this visit: ? ?Hypothyroidism, unspecified type ?-     levothyroxine (SYNTHROID) 100 MCG tablet; TAKE 1 TABLET BY MOUTH DAILY. ? ?Attention deficit hyperactivity disorder (ADHD), predominantly inattentive type ? ?Mixed obsessional thoughts and acts ?-     escitalopram (LEXAPRO) 20 MG tablet; TAKE 1 TABLET (20 MG TOTAL) BY MOUTH DAILY. ?-     buPROPion (WELLBUTRIN XL) 300 MG 24 hr tablet; TAKE 1 TABLET BY MOUTH ONCE A DAY ? ?Anxiety and depression ?-     escitalopram (LEXAPRO) 20 MG tablet; TAKE 1 TABLET (20 MG TOTAL) BY MOUTH DAILY. ?-     buPROPion (WELLBUTRIN XL) 300 MG 24 hr tablet; TAKE 1 TABLET BY MOUTH ONCE A  DAY ? ?MDD (major depressive disorder), recurrent episode, mild ([facility]) ?-     escitalopram (LEXAPRO) 20 MG tablet; TAKE 1 TABLET (20 MG TOTAL) BY MOUTH DAILY. ?-     buPROPion (WELLBUTRIN XL) 300 MG 24 hr tablet; TAKE 1 TABLET BY MOUTH ONCE A DAY ? ? ?PHQ/GAD numbers look good.  ?Refilled wellbutrin and lexapro.  ? ?Will send 90 day of vyvanse.  ? ?TSH done 6 months ago and in range. Sent refills.  ?

## 2022-02-16 ENCOUNTER — Other Ambulatory Visit (HOSPITAL_COMMUNITY): Payer: Self-pay

## 2022-02-18 ENCOUNTER — Other Ambulatory Visit (HOSPITAL_COMMUNITY): Payer: Self-pay

## 2022-03-03 ENCOUNTER — Other Ambulatory Visit (HOSPITAL_COMMUNITY): Payer: Self-pay

## 2022-03-25 ENCOUNTER — Other Ambulatory Visit (HOSPITAL_COMMUNITY): Payer: Self-pay

## 2022-03-25 ENCOUNTER — Other Ambulatory Visit: Payer: Self-pay | Admitting: Physician Assistant

## 2022-03-25 DIAGNOSIS — F900 Attention-deficit hyperactivity disorder, predominantly inattentive type: Secondary | ICD-10-CM

## 2022-03-25 MED ORDER — LISDEXAMFETAMINE DIMESYLATE 70 MG PO CAPS
70.0000 mg | ORAL_CAPSULE | Freq: Every day | ORAL | 0 refills | Status: DC
  Filled 2022-03-25: qty 30, 30d supply, fill #0

## 2022-03-25 NOTE — Telephone Encounter (Signed)
Spoke to the pharmacy and patient only filled a 30 day supply. Can you send refill?  ?

## 2022-03-25 NOTE — Telephone Encounter (Signed)
Last written 02/15/2022 #90 with no refills, too soon. Please sign denial.  ?

## 2022-03-29 ENCOUNTER — Other Ambulatory Visit: Payer: Self-pay | Admitting: Physician Assistant

## 2022-03-29 DIAGNOSIS — Z1231 Encounter for screening mammogram for malignant neoplasm of breast: Secondary | ICD-10-CM

## 2022-03-30 ENCOUNTER — Other Ambulatory Visit (HOSPITAL_COMMUNITY): Payer: Self-pay

## 2022-04-01 ENCOUNTER — Encounter: Payer: Self-pay | Admitting: Physician Assistant

## 2022-04-01 NOTE — Telephone Encounter (Signed)
Can you look at photos and advise if appt is needed?  ?

## 2022-04-01 NOTE — Telephone Encounter (Signed)
Please advise 

## 2022-04-25 ENCOUNTER — Other Ambulatory Visit (HOSPITAL_COMMUNITY): Payer: Self-pay

## 2022-05-03 ENCOUNTER — Other Ambulatory Visit (HOSPITAL_COMMUNITY): Payer: Self-pay

## 2022-05-04 ENCOUNTER — Other Ambulatory Visit (HOSPITAL_COMMUNITY): Payer: Self-pay

## 2022-05-04 DIAGNOSIS — M545 Low back pain, unspecified: Secondary | ICD-10-CM | POA: Diagnosis not present

## 2022-05-04 DIAGNOSIS — M5416 Radiculopathy, lumbar region: Secondary | ICD-10-CM | POA: Diagnosis not present

## 2022-05-04 MED ORDER — PREDNISONE 10 MG PO TABS
ORAL_TABLET | ORAL | 0 refills | Status: DC
  Filled 2022-05-04: qty 21, 12d supply, fill #0

## 2022-05-04 MED ORDER — CYCLOBENZAPRINE HCL 10 MG PO TABS
10.0000 mg | ORAL_TABLET | Freq: Two times a day (BID) | ORAL | 0 refills | Status: DC
  Filled 2022-05-04: qty 60, 30d supply, fill #0

## 2022-05-04 MED ORDER — TRAMADOL HCL 50 MG PO TABS
50.0000 mg | ORAL_TABLET | Freq: Four times a day (QID) | ORAL | 0 refills | Status: DC
Start: 2022-05-04 — End: 2022-12-12
  Filled 2022-05-04: qty 40, 10d supply, fill #0

## 2022-05-05 ENCOUNTER — Encounter: Payer: 59 | Admitting: Sports Medicine

## 2022-05-06 DIAGNOSIS — M545 Low back pain, unspecified: Secondary | ICD-10-CM | POA: Diagnosis not present

## 2022-05-13 DIAGNOSIS — M545 Low back pain, unspecified: Secondary | ICD-10-CM | POA: Diagnosis not present

## 2022-05-16 ENCOUNTER — Other Ambulatory Visit (HOSPITAL_COMMUNITY): Payer: Self-pay

## 2022-05-19 ENCOUNTER — Ambulatory Visit: Payer: 59

## 2022-05-20 ENCOUNTER — Ambulatory Visit: Payer: 59 | Admitting: Physician Assistant

## 2022-06-01 ENCOUNTER — Ambulatory Visit: Payer: 59 | Admitting: Physician Assistant

## 2022-06-01 ENCOUNTER — Ambulatory Visit (INDEPENDENT_AMBULATORY_CARE_PROVIDER_SITE_OTHER): Payer: 59

## 2022-06-01 ENCOUNTER — Other Ambulatory Visit (HOSPITAL_COMMUNITY): Payer: Self-pay

## 2022-06-01 VITALS — BP 141/95 | HR 116 | Ht 66.5 in | Wt 201.0 lb

## 2022-06-01 DIAGNOSIS — E7800 Pure hypercholesterolemia, unspecified: Secondary | ICD-10-CM

## 2022-06-01 DIAGNOSIS — Z1231 Encounter for screening mammogram for malignant neoplasm of breast: Secondary | ICD-10-CM

## 2022-06-01 DIAGNOSIS — F32A Depression, unspecified: Secondary | ICD-10-CM | POA: Diagnosis not present

## 2022-06-01 DIAGNOSIS — E039 Hypothyroidism, unspecified: Secondary | ICD-10-CM | POA: Diagnosis not present

## 2022-06-01 DIAGNOSIS — F330 Major depressive disorder, recurrent, mild: Secondary | ICD-10-CM | POA: Diagnosis not present

## 2022-06-01 DIAGNOSIS — Z131 Encounter for screening for diabetes mellitus: Secondary | ICD-10-CM

## 2022-06-01 DIAGNOSIS — F900 Attention-deficit hyperactivity disorder, predominantly inattentive type: Secondary | ICD-10-CM | POA: Diagnosis not present

## 2022-06-01 DIAGNOSIS — R000 Tachycardia, unspecified: Secondary | ICD-10-CM | POA: Diagnosis not present

## 2022-06-01 DIAGNOSIS — Z79899 Other long term (current) drug therapy: Secondary | ICD-10-CM

## 2022-06-01 DIAGNOSIS — F419 Anxiety disorder, unspecified: Secondary | ICD-10-CM | POA: Diagnosis not present

## 2022-06-01 MED ORDER — LISDEXAMFETAMINE DIMESYLATE 70 MG PO CAPS
70.0000 mg | ORAL_CAPSULE | Freq: Every day | ORAL | 0 refills | Status: DC
  Filled 2022-08-04: qty 30, 30d supply, fill #0

## 2022-06-01 MED ORDER — VORTIOXETINE HBR 20 MG PO TABS
20.0000 mg | ORAL_TABLET | Freq: Every day | ORAL | 0 refills | Status: DC
  Filled 2022-06-01: qty 30, 30d supply, fill #0

## 2022-06-01 MED ORDER — LISDEXAMFETAMINE DIMESYLATE 70 MG PO CAPS
70.0000 mg | ORAL_CAPSULE | Freq: Every day | ORAL | 0 refills | Status: DC
  Filled 2022-06-01: qty 30, 30d supply, fill #0

## 2022-06-01 MED ORDER — LISDEXAMFETAMINE DIMESYLATE 70 MG PO CAPS: 70.0000 mg | ORAL_CAPSULE | Freq: Every day | ORAL | 0 refills | Status: DC

## 2022-06-01 NOTE — Progress Notes (Unsigned)
Established Patient Office Visit  Subjective   Patient ID: Michelle Browning, female    DOB: Sep 27, 1971  Age: 51 y.o. MRN: [id number]  Chief Complaint  Patient presents with   ADHD   Depression    HPI Pt is a 51 yo female with hypothyroidism, ADHD, MDD who presents to the clinic for medication refills.   Pt is doing well on vyvanse. No concerns. She is more anxious, depressed, not motivated since her low back pain and herniated disc. She has been on lexapro and wellbutrin for a long time. No SI/HC. Her job is changing as well. She feels no motavation right now.    .. Active Ambulatory Problems    Diagnosis Date Noted   Thyroid cancer ([place]) 02/07/2012   Anxiety and depression 02/07/2012   Fatigue 02/07/2012   Hypothyroidism 02/20/2013   Hyperlipidemia 05/26/2014   Decreased thyroid stimulating hormone level 06/11/2014   Overweight (BMI 25.0-29.9) 03/29/2015   Class 1 obesity due to excess calories without serious comorbidity with body mass index (BMI) of 30.0 to 30.9 in adult 12/25/2015   Inattention 12/25/2015   OCD (obsessive compulsive disorder) 01/27/2016   Attention deficit hyperactivity disorder (ADHD) 01/27/2016   MDD (major depressive disorder), recurrent episode, mild ([place]) 09/11/2017   Elevated LDL cholesterol level 08/11/2020   Tachycardia 06/02/2022   Resolved Ambulatory Problems    Diagnosis Date Noted   Abnormal weight gain 12/25/2015   Past Medical History:  Diagnosis Date   Cancer ([place]) 2002   Depression    GERD (gastroesophageal reflux disease)    Personal history of radiation therapy 2003   Thyroid disease        ROS   See HPI.  Objective:     BP (!) 141/95   Pulse (!) 116   Ht 5' 6.5" (1.689 m)   Wt 201 lb (91.2 kg)   SpO2 98%   BMI 31.96 kg/m  BP Readings from Last 3 Encounters:  06/01/22 (!) 141/95  02/15/22 135/87  12/08/21 122/80   Wt Readings from Last 3 Encounters:  06/01/22 201 lb (91.2 kg)  02/15/22 199 lb (90.3 kg)   12/08/21 182 lb (82.6 kg)    ..    06/01/2022    3:21 PM 02/15/2022    2:44 PM 05/11/2021    3:42 PM 02/08/2021    4:27 PM 11/04/2020    2:56 PM  Depression screen PHQ 2/9  Decreased Interest 3 0 0 0 0  Down, Depressed, Hopeless 0 0 0 0 0  PHQ - 2 Score 3 0 0 0 0  Altered sleeping 2  0 0 0  Tired, decreased energy 3  0 0 0  Change in appetite 3  0 0 0  Feeling bad or failure about yourself  0  0 0 0  Trouble concentrating 0  0 0 1  Moving slowly or fidgety/restless 0  0 0 0  Suicidal thoughts 0  0 0 0  PHQ-9 Score 11  0 0 1  Difficult doing work/chores Somewhat difficult  Not difficult at all Not difficult at all Not difficult at all   ..    06/01/2022    3:21 PM 05/11/2021    3:42 PM 02/08/2021    4:28 PM 11/04/2020    2:57 PM  GAD 7 : Generalized Anxiety Score  Nervous, Anxious, on Edge 0 0 0 0  Control/stop worrying 0 0 0 0  Worry too much - different things 0 0 0 0  Trouble  relaxing 1 0 0 0  Restless 0 0 0 0  Easily annoyed or irritable 3 0 0 0  Afraid - awful might happen 0 0 0 0  Total GAD 7 Score 4 0 0 0  Anxiety Difficulty Somewhat difficult Not difficult at all Not difficult at all Not difficult at all      Physical Exam Constitutional:      Appearance: Normal appearance. She is obese.  HENT:     Head: Normocephalic.  Cardiovascular:     Rate and Rhythm: Regular rhythm. Tachycardia present.     Pulses: Normal pulses.     Heart sounds: Normal heart sounds.  Pulmonary:     Effort: Pulmonary effort is normal.     Breath sounds: Normal breath sounds.  Neurological:     General: No focal deficit present.     Mental Status: She is alert and oriented to person, place, and time.  Psychiatric:        Mood and Affect: Mood normal.          Assessment & Plan:  .Marland KitchenAgueda was seen today for adhd and depression.  Diagnoses and all orders for this visit:  Attention deficit hyperactivity disorder (ADHD), predominantly inattentive type -     lisdexamfetamine  (VYVANSE) 70 MG capsule; Take 1 capsule by mouth daily. -     lisdexamfetamine (VYVANSE) 70 MG capsule; Take 1 capsule by mouth daily. -     lisdexamfetamine (VYVANSE) 70 MG capsule; Take 1 capsule by mouth daily.  Hypothyroidism, unspecified type -     TSH  Screening for diabetes mellitus -     COMPLETE METABOLIC PANEL WITH GFR  Elevated LDL cholesterol level -     Lipid Panel w/reflex Direct LDL  Medication management -     TSH -     Lipid Panel w/reflex Direct LDL -     COMPLETE METABOLIC PANEL WITH GFR -     CBC with Differential/Platelet  Anxiety and depression -     vortioxetine HBr (TRINTELLIX) 20 MG TABS tablet; Take 1 tablet (20 mg total) by mouth daily.  MDD (major depressive disorder), recurrent episode, mild ([facility]) -     vortioxetine HBr (TRINTELLIX) 20 MG TABS tablet; Take 1 tablet (20 mg total) by mouth daily.  Tachycardia   BP and HR up today. Pt is more anxious than usual. Will continue to monitor. Will check screening labs and thyroid.  Refilled vyvanse.  GAD and PHQ numbers are up.  Pt is going through a lot of transition.  Taper off lexapro and titrate up to trintellix.  Stay on wellbutrin.  Follow up in 3 months.     Return in about 3 months (around 09/01/2022).    Iran Planas, PA-C

## 2022-06-02 ENCOUNTER — Other Ambulatory Visit (HOSPITAL_COMMUNITY): Payer: Self-pay

## 2022-06-02 ENCOUNTER — Encounter: Payer: Self-pay | Admitting: Physician Assistant

## 2022-06-02 DIAGNOSIS — R000 Tachycardia, unspecified: Secondary | ICD-10-CM | POA: Insufficient documentation

## 2022-06-02 NOTE — Progress Notes (Signed)
Normal mammogram. Follow up in 1 year.

## 2022-08-04 ENCOUNTER — Other Ambulatory Visit (HOSPITAL_COMMUNITY): Payer: Self-pay

## 2022-08-09 ENCOUNTER — Other Ambulatory Visit (HOSPITAL_COMMUNITY): Payer: Self-pay

## 2022-08-11 ENCOUNTER — Other Ambulatory Visit (HOSPITAL_COMMUNITY): Payer: Self-pay

## 2022-08-12 ENCOUNTER — Other Ambulatory Visit (HOSPITAL_COMMUNITY): Payer: Self-pay

## 2022-08-24 ENCOUNTER — Other Ambulatory Visit (HOSPITAL_COMMUNITY): Payer: Self-pay

## 2022-09-05 ENCOUNTER — Other Ambulatory Visit (HOSPITAL_COMMUNITY): Payer: Self-pay

## 2022-09-05 ENCOUNTER — Ambulatory Visit (INDEPENDENT_AMBULATORY_CARE_PROVIDER_SITE_OTHER): Payer: 59 | Admitting: Physician Assistant

## 2022-09-05 VITALS — BP 129/84 | HR 83 | Ht 66.5 in | Wt 196.1 lb

## 2022-09-05 DIAGNOSIS — F419 Anxiety disorder, unspecified: Secondary | ICD-10-CM

## 2022-09-05 DIAGNOSIS — F330 Major depressive disorder, recurrent, mild: Secondary | ICD-10-CM

## 2022-09-05 DIAGNOSIS — F900 Attention-deficit hyperactivity disorder, predominantly inattentive type: Secondary | ICD-10-CM | POA: Diagnosis not present

## 2022-09-05 DIAGNOSIS — F422 Mixed obsessional thoughts and acts: Secondary | ICD-10-CM | POA: Diagnosis not present

## 2022-09-05 DIAGNOSIS — F32A Depression, unspecified: Secondary | ICD-10-CM | POA: Diagnosis not present

## 2022-09-05 DIAGNOSIS — Z23 Encounter for immunization: Secondary | ICD-10-CM

## 2022-09-05 MED ORDER — LISDEXAMFETAMINE DIMESYLATE 70 MG PO CAPS: 70.0000 mg | ORAL_CAPSULE | Freq: Every day | ORAL | 0 refills | Status: DC

## 2022-09-05 MED ORDER — LISDEXAMFETAMINE DIMESYLATE 70 MG PO CAPS
70.0000 mg | ORAL_CAPSULE | Freq: Every day | ORAL | 0 refills | Status: DC
  Filled 2022-11-15 – 2022-11-22 (×4): qty 30, 30d supply, fill #0

## 2022-09-05 MED ORDER — LISDEXAMFETAMINE DIMESYLATE 70 MG PO CAPS
70.0000 mg | ORAL_CAPSULE | Freq: Every day | ORAL | 0 refills | Status: DC
  Filled 2022-09-05 – 2022-10-13 (×2): qty 30, 30d supply, fill #0

## 2022-09-05 NOTE — Progress Notes (Signed)
Established Patient Office Visit  Subjective   Patient ID: Michelle Browning, female    DOB: [birth year]  Age: 51 y.o. MRN: [id number]    HPI Pt is a 51 yo female with ADHD, MDD, OCD, GAD, hypothyroidism who presents to the clinic for 3 month follow up.   She is doing pretty well. No problems with focus. No problems with medication. She just switched over to trintellix and not sure if doing better than lexapro or not. She wants to give it some more time. No SI/HC.   Marland Kitchen. Active Ambulatory Problems    Diagnosis Date Noted   Thyroid cancer ([place]) 02/07/2012   Anxiety and depression 02/07/2012   Fatigue 02/07/2012   Hypothyroidism 02/20/2013   Hyperlipidemia 05/26/2014   Decreased thyroid stimulating hormone level 06/11/2014   Overweight (BMI 25.0-29.9) 03/29/2015   Class 1 obesity due to excess calories without serious comorbidity with body mass index (BMI) of 30.0 to 30.9 in adult 12/25/2015   Inattention 12/25/2015   OCD (obsessive compulsive disorder) 01/27/2016   Attention deficit hyperactivity disorder (ADHD) 01/27/2016   MDD (major depressive disorder), recurrent episode, mild ([place]) 09/11/2017   Elevated LDL cholesterol level 08/11/2020   Tachycardia 06/02/2022   Resolved Ambulatory Problems    Diagnosis Date Noted   Abnormal weight gain 12/25/2015   Past Medical History:  Diagnosis Date   Cancer ([place]) 2002   Depression    GERD (gastroesophageal reflux disease)    Personal history of radiation therapy 2003   Thyroid disease       ROS See HPI.    Objective:     BP 129/84   Pulse 83   Ht 5' 6.5" (1.689 m)   Wt 196 lb 1.9 oz (89 kg)   SpO2 100%   BMI 31.18 kg/m  BP Readings from Last 3 Encounters:  09/05/22 129/84  06/01/22 (!) 141/95  02/15/22 135/87   Wt Readings from Last 3 Encounters:  09/05/22 196 lb 1.9 oz (89 kg)  06/01/22 201 lb (91.2 kg)  02/15/22 199 lb (90.3 kg)    .Marland Kitchen    09/05/2022    3:51 PM 06/01/2022    3:21 PM 02/15/2022    2:44 PM  05/11/2021    3:42 PM 02/08/2021    4:27 PM  Depression screen PHQ 2/9  Decreased Interest 1 3 0 0 0  Down, Depressed, Hopeless 1 0 0 0 0  PHQ - 2 Score 2 3 0 0 0  Altered sleeping 0 2  0 0  Tired, decreased energy 1 3  0 0  Change in appetite 0 3  0 0  Feeling bad or failure about yourself  0 0  0 0  Trouble concentrating 1 0  0 0  Moving slowly or fidgety/restless 0 0  0 0  Suicidal thoughts 0 0  0 0  PHQ-9 Score 4 11  0 0  Difficult doing work/chores Somewhat difficult Somewhat difficult  Not difficult at all Not difficult at all   .Marland Kitchen    09/05/2022    3:51 PM 06/01/2022    3:21 PM 05/11/2021    3:42 PM 02/08/2021    4:28 PM  GAD 7 : Generalized Anxiety Score  Nervous, Anxious, on Edge 1 0 0 0  Control/stop worrying 0 0 0 0  Worry too much - different things 0 0 0 0  Trouble relaxing 1 1 0 0  Restless 0 0 0 0  Easily annoyed or irritable 2 3 0 0  Afraid - awful might happen 0 0 0 0  Total GAD 7 Score 4 4 0 0  Anxiety Difficulty Somewhat difficult Somewhat difficult Not difficult at all Not difficult at all      Physical Exam Constitutional:      Appearance: Normal appearance. She is obese.  HENT:     Head: Normocephalic.  Cardiovascular:     Rate and Rhythm: Normal rate and regular rhythm.     Pulses: Normal pulses.  Pulmonary:     Effort: Pulmonary effort is normal.  Neurological:     Mental Status: She is alert.  Psychiatric:        Mood and Affect: Mood normal.          Assessment & Plan:  .Marland KitchenAvigayil was seen today for adhd.  Diagnoses and all orders for this visit:  Attention deficit hyperactivity disorder (ADHD), predominantly inattentive type -     lisdexamfetamine (VYVANSE) 70 MG capsule; Take 1 capsule by mouth daily. -     lisdexamfetamine (VYVANSE) 70 MG capsule; Take 1 capsule by mouth daily. -     lisdexamfetamine (VYVANSE) 70 MG capsule; Take 1 capsule by mouth daily.  Anxiety and depression  MDD (major depressive disorder), recurrent episode,  mild (HCC)  Mixed obsessional thoughts and acts  Need for immunization against influenza -     Flu Vaccine QUAD 38moIM (Fluarix, Fluzone & Alfiuria Quad PF)   Vyvanse refilled for 3 months Continue on trintellix for next 3 months and follow up. PHQ/GAD numbers are better than before.  Flu shot given without problems.    JIran Planas PA-C

## 2022-09-06 ENCOUNTER — Encounter: Payer: Self-pay | Admitting: Physician Assistant

## 2022-09-06 ENCOUNTER — Other Ambulatory Visit (HOSPITAL_COMMUNITY): Payer: Self-pay

## 2022-09-19 ENCOUNTER — Encounter: Payer: Self-pay | Admitting: Physician Assistant

## 2022-09-19 ENCOUNTER — Other Ambulatory Visit: Payer: Self-pay | Admitting: Physician Assistant

## 2022-09-23 ENCOUNTER — Encounter: Payer: Self-pay | Admitting: Physician Assistant

## 2022-10-13 ENCOUNTER — Other Ambulatory Visit (HOSPITAL_COMMUNITY): Payer: Self-pay

## 2022-10-14 ENCOUNTER — Other Ambulatory Visit (HOSPITAL_COMMUNITY): Payer: Self-pay

## 2022-11-15 ENCOUNTER — Other Ambulatory Visit: Payer: Self-pay

## 2022-11-15 ENCOUNTER — Other Ambulatory Visit (HOSPITAL_COMMUNITY): Payer: Self-pay

## 2022-11-16 ENCOUNTER — Other Ambulatory Visit (HOSPITAL_COMMUNITY): Payer: Self-pay

## 2022-11-17 ENCOUNTER — Other Ambulatory Visit (HOSPITAL_COMMUNITY): Payer: Self-pay

## 2022-11-17 ENCOUNTER — Other Ambulatory Visit: Payer: Self-pay

## 2022-11-18 ENCOUNTER — Other Ambulatory Visit: Payer: Self-pay

## 2022-11-22 ENCOUNTER — Other Ambulatory Visit (HOSPITAL_COMMUNITY): Payer: Self-pay

## 2022-11-22 ENCOUNTER — Other Ambulatory Visit: Payer: Self-pay

## 2022-12-06 ENCOUNTER — Ambulatory Visit: Payer: 59 | Admitting: Physician Assistant

## 2022-12-06 DIAGNOSIS — Z131 Encounter for screening for diabetes mellitus: Secondary | ICD-10-CM

## 2022-12-06 DIAGNOSIS — E7800 Pure hypercholesterolemia, unspecified: Secondary | ICD-10-CM

## 2022-12-06 DIAGNOSIS — F900 Attention-deficit hyperactivity disorder, predominantly inattentive type: Secondary | ICD-10-CM

## 2022-12-06 DIAGNOSIS — Z79899 Other long term (current) drug therapy: Secondary | ICD-10-CM

## 2022-12-06 DIAGNOSIS — E039 Hypothyroidism, unspecified: Secondary | ICD-10-CM

## 2022-12-12 ENCOUNTER — Ambulatory Visit (INDEPENDENT_AMBULATORY_CARE_PROVIDER_SITE_OTHER): Payer: Commercial Managed Care - PPO | Admitting: Physician Assistant

## 2022-12-12 ENCOUNTER — Other Ambulatory Visit (HOSPITAL_COMMUNITY): Payer: Self-pay

## 2022-12-12 DIAGNOSIS — F32A Depression, unspecified: Secondary | ICD-10-CM

## 2022-12-12 DIAGNOSIS — F419 Anxiety disorder, unspecified: Secondary | ICD-10-CM

## 2022-12-12 DIAGNOSIS — F422 Mixed obsessional thoughts and acts: Secondary | ICD-10-CM

## 2022-12-12 DIAGNOSIS — F330 Major depressive disorder, recurrent, mild: Secondary | ICD-10-CM

## 2022-12-12 DIAGNOSIS — F900 Attention-deficit hyperactivity disorder, predominantly inattentive type: Secondary | ICD-10-CM | POA: Diagnosis not present

## 2022-12-12 MED ORDER — LISDEXAMFETAMINE DIMESYLATE 70 MG PO CAPS
70.0000 mg | ORAL_CAPSULE | Freq: Every day | ORAL | 0 refills | Status: DC
  Filled 2023-02-16: qty 30, 30d supply, fill #0

## 2022-12-12 MED ORDER — VILAZODONE HCL 40 MG PO TABS
40.0000 mg | ORAL_TABLET | Freq: Every day | ORAL | 0 refills | Status: DC
  Filled 2022-12-12: qty 90, 90d supply, fill #0

## 2022-12-12 MED ORDER — AMPHETAMINE-DEXTROAMPHETAMINE 10 MG PO TABS
10.0000 mg | ORAL_TABLET | Freq: Every day | ORAL | 0 refills | Status: DC
  Filled 2022-12-12: qty 30, 30d supply, fill #0

## 2022-12-12 MED ORDER — LISDEXAMFETAMINE DIMESYLATE 70 MG PO CAPS
70.0000 mg | ORAL_CAPSULE | Freq: Every day | ORAL | 0 refills | Status: DC
  Filled 2022-12-12 – 2023-01-11 (×2): qty 30, 30d supply, fill #0

## 2022-12-12 MED ORDER — LISDEXAMFETAMINE DIMESYLATE 70 MG PO CAPS: 70.0000 mg | ORAL_CAPSULE | Freq: Every day | ORAL | 0 refills | Status: DC

## 2022-12-13 ENCOUNTER — Encounter: Payer: Self-pay | Admitting: Physician Assistant

## 2022-12-13 ENCOUNTER — Other Ambulatory Visit: Payer: Self-pay

## 2022-12-13 NOTE — Progress Notes (Signed)
Established Patient Office Visit  Subjective   Patient ID: Michelle Browning, female    DOB: [birth year]  Age: 52 y.o. MRN: [id number]  Chief Complaint  Patient presents with   Follow-up   ADHD    HPI Pt is a 52 yo female with ADHD, MDD, OcD, anxiety, hypothyroidism who presents to the clinic for follow up.   Pt is not on trintellix or wellbutrin. Pharmacy lost wellbutrin and trintellix made her nauseated. She is struggling with irritability, anger, mood changes. No SI/HC.   Focus is good and stable on vyvanse.   .. Active Ambulatory Problems    Diagnosis Date Noted   Thyroid cancer ([place]) 02/07/2012   Anxiety and depression 02/07/2012   Fatigue 02/07/2012   Hypothyroidism 02/20/2013   Hyperlipidemia 05/26/2014   Decreased thyroid stimulating hormone level 06/11/2014   Overweight (BMI 25.0-29.9) 03/29/2015   Class 1 obesity due to excess calories without serious comorbidity with body mass index (BMI) of 30.0 to 30.9 in adult 12/25/2015   Inattention 12/25/2015   OCD (obsessive compulsive disorder) 01/27/2016   Attention deficit hyperactivity disorder (ADHD) 01/27/2016   MDD (major depressive disorder), recurrent episode, mild ([place]) 09/11/2017   Elevated LDL cholesterol level 08/11/2020   Tachycardia 06/02/2022   Resolved Ambulatory Problems    Diagnosis Date Noted   Abnormal weight gain 12/25/2015   Past Medical History:  Diagnosis Date   Cancer ([place]) 2002   Depression    GERD (gastroesophageal reflux disease)    Personal history of radiation therapy 2003   Thyroid disease     ROS See HPI.    Objective:     BP (!) 132/95   Pulse (!) 102   Ht 5' 6.5" (1.689 m)   Wt 185 lb (83.9 kg)   SpO2 100%   BMI 29.41 kg/m  BP Readings from Last 3 Encounters:  12/12/22 (!) 132/95  09/05/22 129/84  06/01/22 (!) 141/95   Wt Readings from Last 3 Encounters:  12/12/22 185 lb (83.9 kg)  09/05/22 196 lb 1.9 oz (89 kg)  06/01/22 201 lb (91.2 kg)    ..    12/12/2022     2:14 PM 09/05/2022    3:51 PM 06/01/2022    3:21 PM 02/15/2022    2:44 PM 05/11/2021    3:42 PM  Depression screen PHQ 2/9  Decreased Interest 0 1 3 0 0  Down, Depressed, Hopeless 0 1 0 0 0  PHQ - 2 Score 0 2 3 0 0  Altered sleeping 0 0 2  0  Tired, decreased energy 0 1 3  0  Change in appetite 0 0 3  0  Feeling bad or failure about yourself  0 0 0  0  Trouble concentrating 1 1 0  0  Moving slowly or fidgety/restless 0 0 0  0  Suicidal thoughts 0 0 0  0  PHQ-9 Score '1 4 11  '$ 0  Difficult doing work/chores Not difficult at all Somewhat difficult Somewhat difficult  Not difficult at all   ..    12/12/2022    2:14 PM 09/05/2022    3:51 PM 06/01/2022    3:21 PM 05/11/2021    3:42 PM  GAD 7 : Generalized Anxiety Score  Nervous, Anxious, on Edge 1 1 0 0  Control/stop worrying 1 0 0 0  Worry too much - different things 1 0 0 0  Trouble relaxing '1 1 1 '$ 0  Restless 0 0 0 0  Easily annoyed or  irritable '3 2 3 '$ 0  Afraid - awful might happen 1 0 0 0  Total GAD 7 Score '8 4 4 '$ 0  Anxiety Difficulty Somewhat difficult Somewhat difficult Somewhat difficult Not difficult at all      Physical Exam Constitutional:      Appearance: Normal appearance. She is obese.  Cardiovascular:     Rate and Rhythm: Normal rate and regular rhythm.  Pulmonary:     Effort: Pulmonary effort is normal.  Neurological:     General: No focal deficit present.     Mental Status: She is alert and oriented to person, place, and time.  Psychiatric:        Mood and Affect: Mood normal.         Assessment & Plan:  ...Seth was seen today for follow-up and adhd.  Diagnoses and all orders for this visit:  Attention deficit hyperactivity disorder (ADHD), predominantly inattentive type -     lisdexamfetamine (VYVANSE) 70 MG capsule; Take 1 capsule by mouth daily. -     lisdexamfetamine (VYVANSE) 70 MG capsule; Take 1 capsule (70 mg total) by mouth daily. -     lisdexamfetamine (VYVANSE) 70 MG capsule; Take 1  capsule (70 mg total) by mouth daily. -     amphetamine-dextroamphetamine (ADDERALL) 10 MG tablet; Take 1 tablet (10 mg total) by mouth daily at 1pm.  Anxiety and depression  MDD (major depressive disorder), recurrent episode, mild (HCC) -     Vilazodone HCl (VIIBRYD) 40 MG TABS; Take 1 tablet (40 mg total) by mouth daily.  Mixed obsessional thoughts and acts   Refilled vyvanse PHQ/GAD no to goal not on any medication  Failed zoloft/paxil/lexapro/prozac/trintellix Did the best on lexapro and wellbutrin Trial of viibryd Follow up in 3 months   Return in about 3 months (around 03/13/2023).    Iran Planas, PA-C

## 2022-12-26 ENCOUNTER — Other Ambulatory Visit (HOSPITAL_BASED_OUTPATIENT_CLINIC_OR_DEPARTMENT_OTHER): Payer: Self-pay

## 2022-12-26 ENCOUNTER — Other Ambulatory Visit: Payer: Self-pay | Admitting: Physician Assistant

## 2022-12-26 ENCOUNTER — Other Ambulatory Visit (HOSPITAL_COMMUNITY): Payer: Self-pay

## 2022-12-26 ENCOUNTER — Other Ambulatory Visit: Payer: Self-pay

## 2022-12-26 DIAGNOSIS — F419 Anxiety disorder, unspecified: Secondary | ICD-10-CM

## 2022-12-26 DIAGNOSIS — F422 Mixed obsessional thoughts and acts: Secondary | ICD-10-CM

## 2022-12-26 DIAGNOSIS — F32A Depression, unspecified: Secondary | ICD-10-CM

## 2022-12-26 DIAGNOSIS — F330 Major depressive disorder, recurrent, mild: Secondary | ICD-10-CM

## 2022-12-27 ENCOUNTER — Other Ambulatory Visit (HOSPITAL_COMMUNITY): Payer: Self-pay

## 2022-12-27 ENCOUNTER — Telehealth: Payer: Self-pay

## 2022-12-27 ENCOUNTER — Other Ambulatory Visit: Payer: Self-pay

## 2022-12-27 NOTE — Telephone Encounter (Signed)
Agree with above plan.

## 2022-12-27 NOTE — Telephone Encounter (Signed)
I spoke with the pharmacy and they state the manufacturer has changed on the levothyroxine. I gave the ok to fill. I called patient and left a message that she will need to recheck her TSH 6 weeks after starting the new manufacturer levothyroxine.

## 2023-01-11 ENCOUNTER — Other Ambulatory Visit (HOSPITAL_COMMUNITY): Payer: Self-pay

## 2023-01-11 ENCOUNTER — Other Ambulatory Visit: Payer: Self-pay | Admitting: Physician Assistant

## 2023-01-11 DIAGNOSIS — F330 Major depressive disorder, recurrent, mild: Secondary | ICD-10-CM

## 2023-01-11 DIAGNOSIS — F422 Mixed obsessional thoughts and acts: Secondary | ICD-10-CM

## 2023-01-11 DIAGNOSIS — F32A Depression, unspecified: Secondary | ICD-10-CM

## 2023-01-11 DIAGNOSIS — F419 Anxiety disorder, unspecified: Secondary | ICD-10-CM

## 2023-01-12 ENCOUNTER — Other Ambulatory Visit: Payer: Self-pay

## 2023-01-12 ENCOUNTER — Other Ambulatory Visit (HOSPITAL_BASED_OUTPATIENT_CLINIC_OR_DEPARTMENT_OTHER): Payer: Self-pay

## 2023-01-12 ENCOUNTER — Other Ambulatory Visit (HOSPITAL_COMMUNITY): Payer: Self-pay

## 2023-01-13 ENCOUNTER — Other Ambulatory Visit (HOSPITAL_COMMUNITY): Payer: Self-pay

## 2023-01-13 ENCOUNTER — Other Ambulatory Visit: Payer: Self-pay

## 2023-01-13 MED ORDER — BUPROPION HCL ER (XL) 300 MG PO TB24
300.0000 mg | ORAL_TABLET | Freq: Every day | ORAL | 3 refills | Status: DC
  Filled 2023-01-13: qty 90, 90d supply, fill #0
  Filled 2023-05-02 (×2): qty 90, 90d supply, fill #1
  Filled 2023-08-08: qty 90, 90d supply, fill #2
  Filled 2023-09-30 – 2023-11-03 (×2): qty 90, 90d supply, fill #3

## 2023-01-13 NOTE — Telephone Encounter (Signed)
pt has been taking, med was on temp back order, please send new rx. Thanks!

## 2023-02-16 ENCOUNTER — Other Ambulatory Visit (HOSPITAL_COMMUNITY): Payer: Self-pay

## 2023-02-16 ENCOUNTER — Other Ambulatory Visit: Payer: Self-pay

## 2023-03-14 ENCOUNTER — Encounter: Payer: Self-pay | Admitting: Physician Assistant

## 2023-03-14 ENCOUNTER — Ambulatory Visit (INDEPENDENT_AMBULATORY_CARE_PROVIDER_SITE_OTHER): Payer: 59 | Admitting: Physician Assistant

## 2023-03-14 VITALS — BP 124/80 | HR 73 | Ht 66.0 in | Wt 194.0 lb

## 2023-03-14 DIAGNOSIS — Z13220 Encounter for screening for lipoid disorders: Secondary | ICD-10-CM

## 2023-03-14 DIAGNOSIS — Z79899 Other long term (current) drug therapy: Secondary | ICD-10-CM | POA: Diagnosis not present

## 2023-03-14 DIAGNOSIS — Z131 Encounter for screening for diabetes mellitus: Secondary | ICD-10-CM | POA: Diagnosis not present

## 2023-03-14 DIAGNOSIS — Z780 Asymptomatic menopausal state: Secondary | ICD-10-CM | POA: Diagnosis not present

## 2023-03-14 DIAGNOSIS — J069 Acute upper respiratory infection, unspecified: Secondary | ICD-10-CM | POA: Diagnosis not present

## 2023-03-14 DIAGNOSIS — F330 Major depressive disorder, recurrent, mild: Secondary | ICD-10-CM

## 2023-03-14 DIAGNOSIS — F900 Attention-deficit hyperactivity disorder, predominantly inattentive type: Secondary | ICD-10-CM

## 2023-03-14 MED ORDER — LISDEXAMFETAMINE DIMESYLATE 70 MG PO CAPS
70.0000 mg | ORAL_CAPSULE | Freq: Every day | ORAL | 0 refills | Status: DC
Start: 2023-04-13 — End: 2023-06-13
  Filled 2023-05-02 (×2): qty 30, 30d supply, fill #0

## 2023-03-14 MED ORDER — LISDEXAMFETAMINE DIMESYLATE 70 MG PO CAPS
70.00 mg | ORAL_CAPSULE | Freq: Every day | ORAL | 0 refills | Status: DC
Start: 2023-05-14 — End: 2023-06-13
  Filled 2023-06-05: qty 30, 30d supply, fill #0

## 2023-03-14 MED ORDER — AMPHETAMINE-DEXTROAMPHETAMINE 10 MG PO TABS
10.0000 mg | ORAL_TABLET | Freq: Every day | ORAL | 0 refills | Status: DC
Start: 2023-03-14 — End: 2023-06-13
  Filled 2023-03-14: qty 30, 30d supply, fill #0

## 2023-03-14 MED ORDER — VILAZODONE HCL 40 MG PO TABS
40.0000 mg | ORAL_TABLET | Freq: Every day | ORAL | 3 refills | Status: DC
Start: 2023-03-14 — End: 2024-01-26
  Filled 2023-03-14: qty 90, 90d supply, fill #0
  Filled 2023-07-20: qty 90, 90d supply, fill #1
  Filled 2023-09-30 – 2023-10-30 (×2): qty 90, 90d supply, fill #2
  Filled 2023-11-03 – 2024-01-23 (×6): qty 90, 90d supply, fill #3

## 2023-03-14 MED ORDER — LISDEXAMFETAMINE DIMESYLATE 70 MG PO CAPS
70.0000 mg | ORAL_CAPSULE | Freq: Every day | ORAL | 0 refills | Status: DC
  Filled 2023-03-14: qty 30, 30d supply, fill #0

## 2023-03-14 NOTE — Patient Instructions (Signed)
Fezolinetant Tablets What is this medication? FEZOLINETANT (FEZ oh LIN e tant) reduces the number and severity of hot flashes due to menopause. It works by blocking substances in your body that cause hot flashes and night sweats. This medicine may be used for other purposes; ask your health care provider or pharmacist if you have questions. COMMON BRAND NAME(S): VEOZAH What should I tell my care team before I take this medication? They need to know if you have any of these conditions: Kidney disease Liver disease An unusual or allergic reaction to fezolinetant, other medications, foods, dyes, or preservatives Pregnant or trying to get pregnant Breastfeeding How should I use this medication? Take this medication by mouth with water. Take it as directed on the prescription label at the same time every day. Do not cut, crush, or chew this medication. Swallow the tablets whole. You can take it with or without food. If it upsets your stomach, take it with food. Keep taking it unless your care team tells you to stop. Talk to your care team about the use of this medication in children. Special care may be needed. Overdosage: If you think you have taken too much of this medicine contact a poison control center or emergency room at once. NOTE: This medicine is only for you. Do not share this medicine with others. What if I miss a dose? If you miss a dose, take it as soon as you can unless it is more than 12 hours late. If it is more than 12 hours late, skip the missed dose. Take the next dose at the normal time. What may interact with this medication? Other medications may affect the way this medication works. Talk with your care team about all of the medications you take. They may suggest changes to your treatment plan to lower the risk of side effects and to make sure your medications work as intended. This list may not describe all possible interactions. Give your health care provider a list of all  the medicines, herbs, non-prescription drugs, or dietary supplements you use. Also tell them if you smoke, drink alcohol, or use illegal drugs. Some items may interact with your medicine. What should I watch for while using this medication? Visit your care team for regular checks on your progress. Tell your care team if your symptoms do not start to get better or if they get worse. You may need blood work while taking this medication. What side effects may I notice from receiving this medication? Side effects that you should report to your care team as soon as possible: Allergic reactions--skin rash, itching, hives, swelling of the face, lips, tongue, or throat Liver injury--right upper belly pain, loss of appetite, nausea, light-colored stool, dark yellow or brown urine, yellowing skin or eyes, unusual weakness or fatigue Side effects that usually do not require medical attention (report these to your care team if they continue or are bothersome): Back pain Diarrhea Hot flashes Stomach pain Trouble sleeping This list may not describe all possible side effects. Call your doctor for medical advice about side effects. You may report side effects to FDA at 1-800-FDA-1088. Where should I keep my medication? Keep out of the reach of children and pets. Store at room temperature between 20 and 25 degrees C (68 and 77 degrees F). Get rid of any unused medication after the expiration date. To get rid of medications that are no longer needed or have expired: Take the medication to a medication take-back program. Check with   your pharmacy or law enforcement to find a location. If you cannot return the medication, check the label or package insert to see if the medication should be thrown out in the garbage or flushed down the toilet. If you are not sure, ask your care team. If it is safe to put it in the trash, take the medication out of the container. Mix the medication with cat litter, dirt, coffee  grounds, or other unwanted substance. Seal the mixture in a bag or container. Put it in the trash. NOTE: This sheet is a summary. It may not cover all possible information. If you have questions about this medicine, talk to your doctor, pharmacist, or health care provider.  2023 Elsevier/Gold Standard (2022-04-28 00:00:00)  

## 2023-03-14 NOTE — Progress Notes (Unsigned)
Established Patient Office Visit  Subjective   Patient ID: Michelle Browning, female    DOB: 08-Feb-1971  Age: 52 y.o. MRN: [id number]  Chief Complaint  Patient presents with   Follow-up    HPI Pt is a 52 yo female with hypothyroidism, ADHD, HLD, MDD, Anxiety, OCD who presents to the clinic for refills.   She is doing well today on medications. She feels like her mood is good and focus is great. She is sleeping well and energy is good.   She has had a cough for a day or so with some sinus pressure and clear drainage. No fever, chills, headache, ST, SOB, wheezing. She is taking OTC medication.   Active Ambulatory Problems    Diagnosis Date Noted   Thyroid cancer 02/07/2012   Anxiety and depression 02/07/2012   Fatigue 02/07/2012   Hypothyroidism 02/20/2013   Hyperlipidemia 05/26/2014   Decreased thyroid stimulating hormone level 06/11/2014   Overweight (BMI 25.0-29.9) 03/29/2015   Class 1 obesity due to excess calories without serious comorbidity with body mass index (BMI) of 30.0 to 30.9 in adult 12/25/2015   Inattention 12/25/2015   OCD (obsessive compulsive disorder) 01/27/2016   Attention deficit hyperactivity disorder (ADHD) 01/27/2016   MDD (major depressive disorder), recurrent episode, mild 09/11/2017   Elevated LDL cholesterol level 08/11/2020   Tachycardia 06/02/2022   Resolved Ambulatory Problems    Diagnosis Date Noted   Abnormal weight gain 12/25/2015   Past Medical History:  Diagnosis Date   Cancer ([facility]) 2002   Depression    GERD (gastroesophageal reflux disease)    Personal history of radiation therapy 2003   Thyroid disease      ROS   See HPI.  Objective:     BP 124/80   Pulse 73   Ht 5\' 6"  (1.676 m)   Wt 194 lb (88 kg)   SpO2 99%   BMI 31.31 kg/m  BP Readings from Last 3 Encounters:  03/14/23 124/80  12/12/22 (!) 132/95  09/05/22 129/84   Wt Readings from Last 3 Encounters:  03/14/23 194 lb (88 kg)  12/12/22 185 lb (83.9 kg)   09/05/22 196 lb 1.9 oz (89 kg)    ..    03/14/2023    3:10 PM 12/12/2022    2:14 PM 09/05/2022    3:51 PM 06/01/2022    3:21 PM 02/15/2022    2:44 PM  Depression screen PHQ 2/9  Decreased Interest 0 0 1 3 0  Down, Depressed, Hopeless 0 0 1 0 0  PHQ - 2 Score 0 0 2 3 0  Altered sleeping  0 0 2   Tired, decreased energy  0 1 3   Change in appetite  0 0 3   Feeling bad or failure about yourself   0 0 0   Trouble concentrating  1 1 0   Moving slowly or fidgety/restless  0 0 0   Suicidal thoughts  0 0 0   PHQ-9 Score  1 4 11    Difficult doing work/chores  Not difficult at all Somewhat difficult Somewhat difficult    ..    12/12/2022    2:14 PM 09/05/2022    3:51 PM 06/01/2022    3:21 PM 05/11/2021    3:42 PM  GAD 7 : Generalized Anxiety Score  Nervous, Anxious, on Edge 1 1 0 0  Control/stop worrying 1 0 0 0  Worry too much - different things 1 0 0 0  Trouble relaxing 1 1  1 0  Restless 0 0 0 0  Easily annoyed or irritable 3 2 3  0  Afraid - awful might happen 1 0 0 0  Total GAD 7 Score 8 4 4  0  Anxiety Difficulty Somewhat difficult Somewhat difficult Somewhat difficult Not difficult at all      Physical Exam Constitutional:      Appearance: Normal appearance. She is obese.  HENT:     Head: Normocephalic.  Cardiovascular:     Rate and Rhythm: Normal rate.     Pulses: Normal pulses.  Pulmonary:     Effort: Pulmonary effort is normal.     Comments: Dry cough Neurological:     General: No focal deficit present.     Mental Status: She is alert and oriented to person, place, and time.  Psychiatric:        Mood and Affect: Mood normal.          Assessment & Plan:  .Marland KitchenSanniyah was seen today for follow-up.  Diagnoses and all orders for this visit:  Viral URI with cough  Screening for lipid disorders -     Lipid panel  Screening for diabetes mellitus -     Comprehensive metabolic panel  Medication management -     Lipid panel -     Comprehensive metabolic panel -      TSH -     CBC with Differential/Platelet -     VITAMIN D 25 Hydroxy (Vit-D Deficiency, Fractures)  Post-menopausal -     VITAMIN D 25 Hydroxy (Vit-D Deficiency, Fractures)  Attention deficit hyperactivity disorder (ADHD), predominantly inattentive type -     amphetamine-dextroamphetamine (ADDERALL) 10 MG tablet; Take 1 tablet (10 mg total) by mouth daily at 1pm. -     lisdexamfetamine (VYVANSE) 70 MG capsule; Take 1 capsule (70 mg total) by mouth daily. -     lisdexamfetamine (VYVANSE) 70 MG capsule; Take 1 capsule (70 mg total) by mouth daily. -     lisdexamfetamine (VYVANSE) 70 MG capsule; Take 1 capsule (70 mg total) by mouth daily.  MDD (major depressive disorder), recurrent episode, mild -     Vilazodone HCl (VIIBRYD) 40 MG TABS; Take 1 tablet (40 mg total) by mouth daily.   Refilled medications for 3 months.   Discussed URI and symptomatic care if symptoms continue 7-10 days and or worsen please contact clinic Encouraged flonase, tylenol sinus/cold severe.  Drink plenty of fluids   Return in about 3 months (around 06/13/2023).    Tandy Gaw, PA-C

## 2023-03-15 ENCOUNTER — Other Ambulatory Visit: Payer: Self-pay

## 2023-03-15 ENCOUNTER — Other Ambulatory Visit (HOSPITAL_COMMUNITY): Payer: Self-pay

## 2023-03-15 ENCOUNTER — Encounter: Payer: Self-pay | Admitting: Physician Assistant

## 2023-03-15 DIAGNOSIS — R739 Hyperglycemia, unspecified: Secondary | ICD-10-CM | POA: Diagnosis not present

## 2023-03-15 DIAGNOSIS — Z79899 Other long term (current) drug therapy: Secondary | ICD-10-CM | POA: Diagnosis not present

## 2023-03-15 DIAGNOSIS — Z131 Encounter for screening for diabetes mellitus: Secondary | ICD-10-CM | POA: Diagnosis not present

## 2023-03-15 DIAGNOSIS — Z780 Asymptomatic menopausal state: Secondary | ICD-10-CM | POA: Diagnosis not present

## 2023-03-15 DIAGNOSIS — Z13220 Encounter for screening for lipoid disorders: Secondary | ICD-10-CM | POA: Diagnosis not present

## 2023-03-16 LAB — CBC WITH DIFFERENTIAL/PLATELET
Basophils Absolute: 0.1 10*3/uL (ref 0.0–0.2)
Basos: 1 %
EOS (ABSOLUTE): 0.3 10*3/uL (ref 0.0–0.4)
Eos: 4 %
Hematocrit: 45.9 % (ref 34.0–46.6)
Hemoglobin: 15.1 g/dL (ref 11.1–15.9)
Immature Grans (Abs): 0.1 10*3/uL (ref 0.0–0.1)
Immature Granulocytes: 1 %
Lymphocytes Absolute: 3 10*3/uL (ref 0.7–3.1)
Lymphs: 37 %
MCH: 29.2 pg (ref 26.6–33.0)
MCHC: 32.9 g/dL (ref 31.5–35.7)
MCV: 89 fL (ref 79–97)
Monocytes Absolute: 0.7 10*3/uL (ref 0.1–0.9)
Monocytes: 8 %
Neutrophils Absolute: 4.1 10*3/uL (ref 1.4–7.0)
Neutrophils: 49 %
Platelets: 293 10*3/uL (ref 150–450)
RBC: 5.17 x10E6/uL (ref 3.77–5.28)
RDW: 12.9 % (ref 11.7–15.4)
WBC: 8.1 10*3/uL (ref 3.4–10.8)

## 2023-03-16 LAB — LIPID PANEL
Chol/HDL Ratio: 4.2 ratio (ref 0.0–4.4)
Cholesterol, Total: 252 mg/dL — ABNORMAL HIGH (ref 100–199)
HDL: 60 mg/dL (ref >39)
LDL Chol Calc (NIH): 163 mg/dL — ABNORMAL HIGH (ref 0–99)
Triglycerides: 160 mg/dL — ABNORMAL HIGH (ref 0–149)
VLDL Cholesterol Cal: 29 mg/dL (ref 5–40)

## 2023-03-16 LAB — COMPREHENSIVE METABOLIC PANEL
ALT: 15 IU/L (ref 0–32)
AST: 12 IU/L (ref 0–40)
Albumin/Globulin Ratio: 1.5 (ref 1.2–2.2)
Albumin: 4.1 g/dL (ref 3.8–4.9)
Alkaline Phosphatase: 85 IU/L (ref 44–121)
BUN/Creatinine Ratio: 14 (ref 9–23)
BUN: 14 mg/dL (ref 6–24)
Bilirubin Total: <0.2 mg/dL (ref 0.0–1.2)
CO2: 22 mmol/L (ref 20–29)
Calcium: 8.6 mg/dL — ABNORMAL LOW (ref 8.7–10.2)
Chloride: 105 mmol/L (ref 96–106)
Creatinine, Ser: 1.02 mg/dL — ABNORMAL HIGH (ref 0.57–1.00)
Globulin, Total: 2.7 g/dL (ref 1.5–4.5)
Glucose: 102 mg/dL — ABNORMAL HIGH (ref 70–99)
Potassium: 4.7 mmol/L (ref 3.5–5.2)
Sodium: 140 mmol/L (ref 134–144)
Total Protein: 6.8 g/dL (ref 6.0–8.5)
eGFR: 67 mL/min/{1.73_m2} (ref >59)

## 2023-03-16 LAB — VITAMIN D 25 HYDROXY (VIT D DEFICIENCY, FRACTURES): Vit D, 25-Hydroxy: 23.8 ng/mL — ABNORMAL LOW (ref 30.0–100.0)

## 2023-03-16 LAB — TSH: TSH: 3.92 u[IU]/mL (ref 0.450–4.500)

## 2023-03-16 NOTE — Progress Notes (Signed)
TSH is up quite a bit from 1 year ago. We could increase your supplementation if you are having hypothyroidism symptoms to get you closer to optimal goal of 1-2. Thoughts?   Vitamin d is low. How much are you taking daily need to increase by 2000 units and take with dairy.   Glucose is elevated a little. Will get nurse to add A1C to better evaluate average.   LDL not to goal.  HDL, good cholesterol, looks great!  TG elevated as well.   Need to target diet and exercise to improve this numbers. Recheck in 6 months.

## 2023-03-22 ENCOUNTER — Encounter: Payer: Self-pay | Admitting: Physician Assistant

## 2023-03-22 DIAGNOSIS — R7303 Prediabetes: Secondary | ICD-10-CM | POA: Insufficient documentation

## 2023-03-22 LAB — HGB A1C W/O EAG: Hgb A1c MFr Bld: 5.7 % — ABNORMAL HIGH (ref 4.8–5.6)

## 2023-03-22 LAB — SPECIMEN STATUS REPORT

## 2023-03-22 NOTE — Progress Notes (Signed)
A1C in pre-diabetes range. Goal of of exercise a week. Decrease sugars/carbs in diet. Consider adding metformin to help with insulin resistance and make weight loss a little easier. Consider intermittent fasting 16:8 to help with weight loss and insulin resistance.

## 2023-03-28 ENCOUNTER — Other Ambulatory Visit: Payer: Self-pay

## 2023-03-28 MED ORDER — LEVOTHYROXINE SODIUM 112 MCG PO TABS
112.0000 ug | ORAL_TABLET | Freq: Every day | ORAL | 0 refills | Status: DC
  Filled 2023-03-28: qty 90, 90d supply, fill #0

## 2023-05-02 ENCOUNTER — Other Ambulatory Visit: Payer: Self-pay

## 2023-05-02 ENCOUNTER — Other Ambulatory Visit (HOSPITAL_COMMUNITY): Payer: Self-pay

## 2023-06-05 ENCOUNTER — Other Ambulatory Visit (HOSPITAL_COMMUNITY): Payer: Self-pay

## 2023-06-05 ENCOUNTER — Other Ambulatory Visit: Payer: Self-pay

## 2023-06-13 ENCOUNTER — Other Ambulatory Visit: Payer: Self-pay

## 2023-06-13 ENCOUNTER — Ambulatory Visit (INDEPENDENT_AMBULATORY_CARE_PROVIDER_SITE_OTHER): Payer: 59 | Admitting: Physician Assistant

## 2023-06-13 ENCOUNTER — Encounter: Payer: Self-pay | Admitting: Physician Assistant

## 2023-06-13 ENCOUNTER — Other Ambulatory Visit (HOSPITAL_COMMUNITY): Payer: Self-pay

## 2023-06-13 VITALS — BP 130/79 | HR 56 | Ht 66.0 in | Wt 195.0 lb

## 2023-06-13 DIAGNOSIS — F419 Anxiety disorder, unspecified: Secondary | ICD-10-CM

## 2023-06-13 DIAGNOSIS — F422 Mixed obsessional thoughts and acts: Secondary | ICD-10-CM | POA: Diagnosis not present

## 2023-06-13 DIAGNOSIS — F900 Attention-deficit hyperactivity disorder, predominantly inattentive type: Secondary | ICD-10-CM | POA: Diagnosis not present

## 2023-06-13 DIAGNOSIS — F330 Major depressive disorder, recurrent, mild: Secondary | ICD-10-CM

## 2023-06-13 DIAGNOSIS — E039 Hypothyroidism, unspecified: Secondary | ICD-10-CM | POA: Diagnosis not present

## 2023-06-13 MED ORDER — LISDEXAMFETAMINE DIMESYLATE 70 MG PO CAPS
70.0000 mg | ORAL_CAPSULE | Freq: Every day | ORAL | 0 refills | Status: DC
Start: 2023-06-13 — End: 2023-10-02
  Filled 2023-06-13: qty 30, 30d supply, fill #0
  Filled 2023-07-06: qty 10, 10d supply, fill #0
  Filled 2023-07-06: qty 20, 20d supply, fill #0

## 2023-06-13 MED ORDER — AMPHETAMINE-DEXTROAMPHETAMINE 10 MG PO TABS
10.0000 mg | ORAL_TABLET | Freq: Every day | ORAL | 0 refills | Status: DC
Start: 2023-06-13 — End: 2023-07-06
  Filled 2023-06-13: qty 30, 30d supply, fill #0

## 2023-06-13 MED ORDER — LEVOTHYROXINE SODIUM 112 MCG PO TABS
112.0000 ug | ORAL_TABLET | Freq: Every day | ORAL | 0 refills | Status: DC
Start: 2023-06-13 — End: 2023-09-29
  Filled 2023-06-13: qty 90, 90d supply, fill #0

## 2023-06-13 MED ORDER — LISDEXAMFETAMINE DIMESYLATE 70 MG PO CAPS
70.0000 mg | ORAL_CAPSULE | Freq: Every day | ORAL | 0 refills | Status: DC
Start: 2023-08-14 — End: 2023-10-02
  Filled 2023-09-21: qty 30, 30d supply, fill #0

## 2023-06-13 MED ORDER — LISDEXAMFETAMINE DIMESYLATE 70 MG PO CAPS
70.0000 mg | ORAL_CAPSULE | Freq: Every day | ORAL | 0 refills | Status: DC
Start: 2023-07-14 — End: 2023-10-02
  Filled 2023-08-24: qty 30, 30d supply, fill #0

## 2023-06-13 NOTE — Progress Notes (Signed)
Established Patient Office Visit  Subjective   Patient ID: Michelle Browning, female    DOB: 10/02/1971  Age: 52 y.o. MRN: [id number]  Chief Complaint  Patient presents with   Follow-up    Fup on medication phq-gad    HPI Pt is a 52 yo female with hypothyroidism, HLD, OCD, ADHD, MDD, GAD who presents to the clinic for medication follow up and refills.   Pt is doing well on wellbutrin XL, vyvanse, vibryd and adderall as needed in the afternoon for mood and focus. No concerns. She is doing great. She is staying active.   No concerns with thyroid. Taking synthroid daily.    . Active Ambulatory Problems    Diagnosis Date Noted   Thyroid cancer (HCC) 02/07/2012   Anxiety and depression 02/07/2012   Fatigue 02/07/2012   Hypothyroidism 02/20/2013   Hyperlipidemia 05/26/2014   Decreased thyroid stimulating hormone level 06/11/2014   Overweight (BMI 25.0-29.9) 03/29/2015   Class 1 obesity due to excess calories without serious comorbidity with body mass index (BMI) of 30.0 to 30.9 in adult 12/25/2015   Inattention 12/25/2015   OCD (obsessive compulsive disorder) 01/27/2016   Attention deficit hyperactivity disorder (ADHD) 01/27/2016   MDD (major depressive disorder), recurrent episode, mild (HCC) 09/11/2017   Elevated LDL cholesterol level 08/11/2020   Tachycardia 06/02/2022   Pre-diabetes 03/22/2023   Resolved Ambulatory Problems    Diagnosis Date Noted   Abnormal weight gain 12/25/2015   Past Medical History:  Diagnosis Date   Cancer (HCC) 2002   Depression    GERD (gastroesophageal reflux disease)    Personal history of radiation therapy 2003   Thyroid disease      Review of Systems  All other systems reviewed and are negative.     Objective:     BP 130/79   Pulse (!) 56   Ht 5\' 6"  (1.676 m)   Wt 195 lb (88.5 kg)   SpO2 99%   BMI 31.47 kg/m  BP Readings from Last 3 Encounters:  06/13/23 130/79  03/14/23 124/80  12/12/22 (!) 132/95   Wt Readings from  Last 3 Encounters:  06/13/23 195 lb (88.5 kg)  03/14/23 194 lb (88 kg)  12/12/22 185 lb (83.9 kg)    ..    06/13/2023    3:56 PM 03/14/2023    3:10 PM 12/12/2022    2:14 PM 09/05/2022    3:51 PM 06/01/2022    3:21 PM  Depression screen PHQ 2/9  Decreased Interest 0 0 0 1 3  Down, Depressed, Hopeless 0 0 0 1 0  PHQ - 2 Score 0 0 0 2 3  Altered sleeping 0  0 0 2  Tired, decreased energy 0  0 1 3  Change in appetite 0  0 0 3  Feeling bad or failure about yourself  0  0 0 0  Trouble concentrating 0  1 1 0  Moving slowly or fidgety/restless 0  0 0 0  Suicidal thoughts 0  0 0 0  PHQ-9 Score 0  1 4 11   Difficult doing work/chores Not difficult at all  Not difficult at all Somewhat difficult Somewhat difficult   ..    06/13/2023    3:56 PM 12/12/2022    2:14 PM 09/05/2022    3:51 PM 06/01/2022    3:21 PM  GAD 7 : Generalized Anxiety Score  Nervous, Anxious, on Edge 0 1 1 0  Control/stop worrying 0 1 0 0  Worry too much -  different things 0 1 0 0  Trouble relaxing 0 1 1 1   Restless 0 0 0 0  Easily annoyed or irritable 0 3 2 3   Afraid - awful might happen 0 1 0 0  Total GAD 7 Score 0 8 4 4   Anxiety Difficulty Not difficult at all Somewhat difficult Somewhat difficult Somewhat difficult      Physical Exam Constitutional:      Appearance: Normal appearance. She is obese.  HENT:     Head: Normocephalic.  Cardiovascular:     Rate and Rhythm: Normal rate and regular rhythm.     Pulses: Normal pulses.     Heart sounds: Normal heart sounds.  Pulmonary:     Effort: Pulmonary effort is normal.     Breath sounds: Normal breath sounds.  Neurological:     General: No focal deficit present.     Mental Status: She is alert and oriented to person, place, and time.  Psychiatric:        Mood and Affect: Mood normal.        Assessment & Plan:  .Marland KitchenAdalai was seen today for follow-up.  Diagnoses and all orders for this visit:  Attention deficit hyperactivity disorder (ADHD),  predominantly inattentive type -     lisdexamfetamine (VYVANSE) 70 MG capsule; Take 1 capsule (70 mg total) by mouth daily. -     lisdexamfetamine (VYVANSE) 70 MG capsule; Take 1 capsule (70 mg total) by mouth daily. -     lisdexamfetamine (VYVANSE) 70 MG capsule; Take 1 capsule (70 mg total) by mouth daily. -     amphetamine-dextroamphetamine (ADDERALL) 10 MG tablet; Take 1 tablet (10 mg total) by mouth daily at 1pm.  Acquired hypothyroidism -     levothyroxine (SYNTHROID) 112 MCG tablet; Take 1 tablet (112 mcg total) by mouth daily before breakfast.  Mixed obsessional thoughts and acts  MDD (major depressive disorder), recurrent episode, mild (HCC)  Anxiety and depression   PHQ/GAD numbers look great Stay on same medications and will refill as needed TSH lab UTD Refilled synthroid Refilled vyvanse with as needed adderall in the afternoon for 3 months Follow up in 3 months.    Return in about 3 months (around 09/13/2023).    Tandy Gaw, PA-C

## 2023-06-14 ENCOUNTER — Other Ambulatory Visit: Payer: Self-pay

## 2023-07-03 ENCOUNTER — Other Ambulatory Visit: Payer: Self-pay | Admitting: Physician Assistant

## 2023-07-03 DIAGNOSIS — Z1231 Encounter for screening mammogram for malignant neoplasm of breast: Secondary | ICD-10-CM

## 2023-07-04 ENCOUNTER — Other Ambulatory Visit: Payer: Self-pay | Admitting: Oncology

## 2023-07-04 DIAGNOSIS — Z006 Encounter for examination for normal comparison and control in clinical research program: Secondary | ICD-10-CM

## 2023-07-06 ENCOUNTER — Other Ambulatory Visit (HOSPITAL_BASED_OUTPATIENT_CLINIC_OR_DEPARTMENT_OTHER): Payer: Self-pay

## 2023-07-06 ENCOUNTER — Other Ambulatory Visit: Payer: Self-pay | Admitting: Physician Assistant

## 2023-07-06 ENCOUNTER — Other Ambulatory Visit (HOSPITAL_COMMUNITY): Payer: Self-pay

## 2023-07-06 DIAGNOSIS — F900 Attention-deficit hyperactivity disorder, predominantly inattentive type: Secondary | ICD-10-CM

## 2023-07-07 ENCOUNTER — Other Ambulatory Visit: Payer: Self-pay

## 2023-07-07 ENCOUNTER — Other Ambulatory Visit (HOSPITAL_COMMUNITY): Payer: Self-pay

## 2023-07-07 ENCOUNTER — Other Ambulatory Visit (HOSPITAL_BASED_OUTPATIENT_CLINIC_OR_DEPARTMENT_OTHER): Payer: Self-pay

## 2023-07-07 MED ORDER — AMPHETAMINE-DEXTROAMPHETAMINE 10 MG PO TABS
10.0000 mg | ORAL_TABLET | Freq: Every day | ORAL | 0 refills | Status: DC
Start: 2023-07-07 — End: 2023-10-02
  Filled 2023-07-07 – 2023-07-20 (×4): qty 30, 30d supply, fill #0

## 2023-07-07 MED ORDER — AMPHETAMINE-DEXTROAMPHETAMINE 10 MG PO TABS
10.0000 mg | ORAL_TABLET | Freq: Every day | ORAL | 0 refills | Status: DC
  Filled 2023-08-24: qty 30, 30d supply, fill #0

## 2023-07-11 ENCOUNTER — Other Ambulatory Visit (HOSPITAL_COMMUNITY): Payer: Self-pay

## 2023-07-11 ENCOUNTER — Other Ambulatory Visit: Payer: Self-pay

## 2023-07-19 ENCOUNTER — Other Ambulatory Visit (HOSPITAL_COMMUNITY): Payer: Self-pay

## 2023-07-20 ENCOUNTER — Other Ambulatory Visit: Payer: Self-pay

## 2023-07-20 ENCOUNTER — Other Ambulatory Visit (HOSPITAL_COMMUNITY): Payer: Self-pay

## 2023-07-21 ENCOUNTER — Other Ambulatory Visit: Payer: Self-pay

## 2023-07-26 ENCOUNTER — Other Ambulatory Visit: Payer: Self-pay

## 2023-07-26 ENCOUNTER — Encounter (HOSPITAL_COMMUNITY): Payer: Self-pay

## 2023-07-26 ENCOUNTER — Other Ambulatory Visit (HOSPITAL_COMMUNITY): Payer: Self-pay

## 2023-07-28 ENCOUNTER — Other Ambulatory Visit (HOSPITAL_COMMUNITY): Payer: Self-pay

## 2023-07-29 ENCOUNTER — Other Ambulatory Visit (HOSPITAL_COMMUNITY): Payer: Self-pay

## 2023-08-01 ENCOUNTER — Other Ambulatory Visit (HOSPITAL_COMMUNITY): Payer: Self-pay

## 2023-08-01 ENCOUNTER — Other Ambulatory Visit (HOSPITAL_BASED_OUTPATIENT_CLINIC_OR_DEPARTMENT_OTHER): Payer: Self-pay

## 2023-08-02 ENCOUNTER — Ambulatory Visit: Payer: 59

## 2023-08-02 DIAGNOSIS — Z1231 Encounter for screening mammogram for malignant neoplasm of breast: Secondary | ICD-10-CM

## 2023-08-04 NOTE — Progress Notes (Signed)
Normal mammogram. Follow up in 1 year.

## 2023-08-08 ENCOUNTER — Other Ambulatory Visit (HOSPITAL_COMMUNITY): Payer: Self-pay

## 2023-08-24 ENCOUNTER — Other Ambulatory Visit (HOSPITAL_COMMUNITY): Payer: Self-pay

## 2023-09-13 ENCOUNTER — Ambulatory Visit: Payer: 59 | Admitting: Physician Assistant

## 2023-09-18 ENCOUNTER — Other Ambulatory Visit (HOSPITAL_COMMUNITY): Payer: Self-pay

## 2023-09-21 ENCOUNTER — Other Ambulatory Visit: Payer: Self-pay

## 2023-09-21 ENCOUNTER — Other Ambulatory Visit (HOSPITAL_COMMUNITY): Payer: Self-pay

## 2023-09-29 ENCOUNTER — Ambulatory Visit (INDEPENDENT_AMBULATORY_CARE_PROVIDER_SITE_OTHER): Payer: 59 | Admitting: Physician Assistant

## 2023-09-29 ENCOUNTER — Encounter: Payer: Self-pay | Admitting: Physician Assistant

## 2023-09-29 ENCOUNTER — Encounter: Payer: Self-pay | Admitting: Pharmacist

## 2023-09-29 ENCOUNTER — Other Ambulatory Visit: Payer: Self-pay

## 2023-09-29 VITALS — BP 149/86 | HR 77 | Ht 66.5 in | Wt 185.8 lb

## 2023-09-29 DIAGNOSIS — E559 Vitamin D deficiency, unspecified: Secondary | ICD-10-CM

## 2023-09-29 DIAGNOSIS — R7303 Prediabetes: Secondary | ICD-10-CM

## 2023-09-29 DIAGNOSIS — F900 Attention-deficit hyperactivity disorder, predominantly inattentive type: Secondary | ICD-10-CM

## 2023-09-29 DIAGNOSIS — R112 Nausea with vomiting, unspecified: Secondary | ICD-10-CM

## 2023-09-29 DIAGNOSIS — F330 Major depressive disorder, recurrent, mild: Secondary | ICD-10-CM | POA: Diagnosis not present

## 2023-09-29 DIAGNOSIS — F419 Anxiety disorder, unspecified: Secondary | ICD-10-CM

## 2023-09-29 DIAGNOSIS — N951 Menopausal and female climacteric states: Secondary | ICD-10-CM

## 2023-09-29 DIAGNOSIS — E039 Hypothyroidism, unspecified: Secondary | ICD-10-CM

## 2023-09-29 DIAGNOSIS — F422 Mixed obsessional thoughts and acts: Secondary | ICD-10-CM

## 2023-09-29 DIAGNOSIS — R634 Abnormal weight loss: Secondary | ICD-10-CM

## 2023-09-29 DIAGNOSIS — Z23 Encounter for immunization: Secondary | ICD-10-CM | POA: Diagnosis not present

## 2023-09-29 MED ORDER — VEOZAH 45 MG PO TABS
1.0000 | ORAL_TABLET | Freq: Every day | ORAL | 5 refills | Status: DC
  Filled 2023-09-29 – 2023-09-30 (×2): qty 30, 30d supply, fill #0
  Filled 2023-10-30: qty 30, 30d supply, fill #1
  Filled 2023-11-03 – 2023-11-27 (×2): qty 30, 30d supply, fill #2
  Filled 2023-12-05 – 2024-01-01 (×3): qty 30, 30d supply, fill #3
  Filled 2024-04-30: qty 30, 30d supply, fill #4
  Filled 2024-05-26: qty 30, 30d supply, fill #5

## 2023-09-29 MED ORDER — LEVOTHYROXINE SODIUM 112 MCG PO TABS
112.0000 ug | ORAL_TABLET | Freq: Every day | ORAL | 1 refills | Status: DC
  Filled 2023-09-29: qty 90, 90d supply, fill #0
  Filled 2023-09-30 – 2023-12-11 (×5): qty 90, 90d supply, fill #1

## 2023-09-29 MED ORDER — LEVOTHYROXINE SODIUM 112 MCG PO TABS
112.0000 ug | ORAL_TABLET | Freq: Every day | ORAL | 1 refills | Status: DC
  Filled 2023-09-29: qty 90, 90d supply, fill #0

## 2023-09-29 MED ORDER — OMEPRAZOLE 40 MG PO CPDR
40.0000 mg | DELAYED_RELEASE_CAPSULE | Freq: Every day | ORAL | 1 refills | Status: DC
  Filled 2023-09-29: qty 90, 90d supply, fill #0
  Filled 2023-09-30 – 2024-01-01 (×6): qty 90, 90d supply, fill #1

## 2023-09-29 NOTE — Patient Instructions (Addendum)
Start omeprazole in the morning Veozah can be at night or in the morning.   Fezolinetant Tablets What is this medication? FEZOLINETANT (FEZ oh LIN e tant) reduces the number and severity of hot flashes due to menopause. It works by blocking substances in your body that cause hot flashes and night sweats. This medicine may be used for other purposes; ask your health care provider or pharmacist if you have questions. COMMON BRAND NAME(S): VEOZAH What should I tell my care team before I take this medication? They need to know if you have any of these conditions: Kidney disease Liver disease An unusual or allergic reaction to fezolinetant, other medications, foods, dyes, or preservatives Pregnant or trying to get pregnant Breastfeeding How should I use this medication? Take this medication by mouth with water. Take it as directed on the prescription label at the same time every day. Do not cut, crush, or chew this medication. Swallow the tablets whole. You can take it with or without food. If it upsets your stomach, take it with food. Keep taking it unless your care team tells you to stop. Talk to your care team about the use of this medication in children. Special care may be needed. Overdosage: If you think you have taken too much of this medicine contact a poison control center or emergency room at once. NOTE: This medicine is only for you. Do not share this medicine with others. What if I miss a dose? If you miss a dose, take it as soon as you can unless it is more than 12 hours late. If it is more than 12 hours late, skip the missed dose. Take the next dose at the normal time. What may interact with this medication? Other medications may affect the way this medication works. Talk with your care team about all of the medications you take. They may suggest changes to your treatment plan to lower the risk of side effects and to make sure your medications work as intended. This list may not  describe all possible interactions. Give your health care provider a list of all the medicines, herbs, non-prescription drugs, or dietary supplements you use. Also tell them if you smoke, drink alcohol, or use illegal drugs. Some items may interact with your medicine. What should I watch for while using this medication? Visit your care team for regular checks on your progress. Tell your care team if your symptoms do not start to get better or if they get worse. You may need blood work while taking this medication. What side effects may I notice from receiving this medication? Side effects that you should report to your care team as soon as possible: Allergic reactions--skin rash, itching, hives, swelling of the face, lips, tongue, or throat Liver injury--right upper belly pain, loss of appetite, nausea, light-colored stool, dark yellow or brown urine, yellowing skin or eyes, unusual weakness or fatigue Side effects that usually do not require medical attention (report these to your care team if they continue or are bothersome): Back pain Diarrhea Hot flashes Stomach pain Trouble sleeping This list may not describe all possible side effects. Call your doctor for medical advice about side effects. You may report side effects to FDA at 1-800-FDA-1088. Where should I keep my medication? Keep out of the reach of children and pets. Store at room temperature between 20 and 25 degrees C (68 and 77 degrees F). Get rid of any unused medication after the expiration date. To get rid of medications that are  no longer needed or have expired: Take the medication to a medication take-back program. Check with your pharmacy or law enforcement to find a location. If you cannot return the medication, check the label or package insert to see if the medication should be thrown out in the garbage or flushed down the toilet. If you are not sure, ask your care team. If it is safe to put it in the trash, take the  medication out of the container. Mix the medication with cat litter, dirt, coffee grounds, or other unwanted substance. Seal the mixture in a bag or container. Put it in the trash. NOTE: This sheet is a summary. It may not cover all possible information. If you have questions about this medicine, talk to your doctor, pharmacist, or health care provider.  2024 Elsevier/Gold Standard (2022-04-28 00:00:00)

## 2023-09-29 NOTE — Progress Notes (Unsigned)
Established Patient Office Visit  Subjective   Patient ID: Michelle Browning, female    DOB: [birth year]  Age: 52 y.o. MRN: [id number]  Chief Complaint  Patient presents with   ADHD   mood follow up     HPI Pt is a 52 yo female with ADHD, MDD, GAD, hypothyroidism, pre-diabetes who presents to the clinic for 3 month follow up.   Her ADHD is controlled on vyvanse and afternoon adderall. Her mood is very good. No concerns.   She is trying to eat better and stay active due to elevated sugars. She needs A1C rechecked today.   She continues to have hot flashes throughout day and night. They are really bothersome and does not want to start HRT.   She is also having nausea and vomiting intermittently with no etiology. At times in the morning other times during the day. She feels better after vomiting. Not related to eating. Taking tums as needed and helps some. No medication changes. No problems with choking or swallowing. No GI surgeries. No diarrhea or constipation. She has lost 10lbs with very little effort.   Active Ambulatory Problems    Diagnosis Date Noted   Thyroid cancer (HCC) 02/07/2012   Anxiety and depression 02/07/2012   Fatigue 02/07/2012   Hypothyroidism 02/20/2013   Hyperlipidemia 05/26/2014   Decreased thyroid stimulating hormone level 06/11/2014   Overweight (BMI 25.0-29.9) 03/29/2015   Class 1 obesity due to excess calories without serious comorbidity with body mass index (BMI) of 30.0 to 30.9 in adult 12/25/2015   Inattention 12/25/2015   OCD (obsessive compulsive disorder) 01/27/2016   Attention deficit hyperactivity disorder (ADHD) 01/27/2016   MDD (major depressive disorder), recurrent episode, mild (HCC) 09/11/2017   Elevated LDL cholesterol level 08/11/2020   Tachycardia 06/02/2022   Pre-diabetes 03/22/2023   Menopausal syndrome (hot flashes) 09/29/2023   Nausea and vomiting 09/29/2023   Resolved Ambulatory Problems    Diagnosis Date Noted   Abnormal  weight gain 12/25/2015   Past Medical History:  Diagnosis Date   Cancer (HCC) 2002   Depression    GERD (gastroesophageal reflux disease)    Personal history of radiation therapy 2003   Thyroid disease      ROS   See HPI.  Objective:     BP (!) 149/86   Pulse 77   Ht 5' 6.5" (1.689 m)   Wt 185 lb 12 oz (84.3 kg)   SpO2 98%   BMI 29.53 kg/m  BP Readings from Last 3 Encounters:  09/29/23 (!) 149/86  06/13/23 130/79  03/14/23 124/80   Wt Readings from Last 3 Encounters:  09/29/23 185 lb 12 oz (84.3 kg)  06/13/23 195 lb (88.5 kg)  03/14/23 194 lb (88 kg)      Physical Exam Constitutional:      Appearance: Normal appearance.  HENT:     Head: Normocephalic.  Cardiovascular:     Rate and Rhythm: Normal rate and regular rhythm.     Heart sounds: Normal heart sounds.  Pulmonary:     Effort: Pulmonary effort is normal.     Breath sounds: Normal breath sounds.  Musculoskeletal:     Cervical back: Normal range of motion and neck supple. No tenderness.     Right lower leg: No edema.     Left lower leg: No edema.  Lymphadenopathy:     Cervical: No cervical adenopathy.  Neurological:     General: No focal deficit present.     Mental Status: She  is alert and oriented to person, place, and time.  Psychiatric:        Mood and Affect: Mood normal.         Assessment & Plan:  .Marland KitchenMaddie was seen today for adhd and mood follow up .  Diagnoses and all orders for this visit:  Pre-diabetes -     Cancel: CMP14+EGFR -     Cancel: Hemoglobin A1c -     CMP14+EGFR -     Hemoglobin A1c  Attention deficit hyperactivity disorder (ADHD), predominantly inattentive type -     lisdexamfetamine (VYVANSE) 70 MG capsule; Take 1 capsule (70 mg total) by mouth daily. -     lisdexamfetamine (VYVANSE) 70 MG capsule; Take 1 capsule (70 mg total) by mouth daily. -     lisdexamfetamine (VYVANSE) 70 MG capsule; Take 1 capsule (70 mg total) by mouth daily. -      amphetamine-dextroamphetamine (ADDERALL) 10 MG tablet; Take 1 tablet (10 mg total) by mouth daily at 1pm. -     amphetamine-dextroamphetamine (ADDERALL) 10 MG tablet; Take 1 tablet (10 mg total) by mouth daily at 1pm. -     amphetamine-dextroamphetamine (ADDERALL) 10 MG tablet; Take 1 tablet (10 mg total) by mouth daily at 1pm.  Need for immunization against influenza -     Flu vaccine trivalent PF, 6mos and older(Flulaval,Afluria,Fluarix,Fluzone)  Acquired hypothyroidism -     Discontinue: levothyroxine (SYNTHROID) 112 MCG tablet; Take 1 tablet (112 mcg total) by mouth daily before breakfast. -     levothyroxine (SYNTHROID) 112 MCG tablet; Take 1 tablet (112 mcg total) by mouth daily before breakfast. -     TSH  Vitamin D deficiency -     Cancel: VITAMIN D 25 Hydroxy (Vit-D Deficiency, Fractures) -     VITAMIN D 25 Hydroxy (Vit-D Deficiency, Fractures)  Mixed obsessional thoughts and acts  Anxiety and depression  MDD (major depressive disorder), recurrent episode, mild (HCC)  Nausea and vomiting, unspecified vomiting type -     omeprazole (PRILOSEC) 40 MG capsule; Take 1 capsule (40 mg total) by mouth daily. -     Lipase -     Celiac Disease Panel -     CBC w/Diff/Platelet  Menopausal syndrome (hot flashes) -     Fezolinetant (VEOZAH) 45 MG TABS; Take 1 tablet (45 mg total) by mouth daily.  Weight loss  Encounter for immunization -     Flu vaccine trivalent PF, 6mos and older(Flulaval,Afluria,Fluarix,Fluzone) -     Varicella-zoster vaccine IM   Vyvanse and Adderall refilled.  Vaccines UTD with flu and shingles given today  Will get labs and add A1C in labs to evaluate for diabetes  Unclear etiology of nausea and vomiting Will get cbc/cmp/lipase/celiac disease UTD colonoscopy Trial of omeprazole if not improving will refer to GI for endoscopy work up  Discussed Hot flashes Start Veozah  Follow up in 3 months    Return in about 3 months (around 12/30/2023).     Tandy Gaw, PA-C

## 2023-09-30 ENCOUNTER — Other Ambulatory Visit: Payer: Self-pay | Admitting: Physician Assistant

## 2023-09-30 DIAGNOSIS — F900 Attention-deficit hyperactivity disorder, predominantly inattentive type: Secondary | ICD-10-CM

## 2023-10-02 ENCOUNTER — Encounter: Payer: Self-pay | Admitting: Physician Assistant

## 2023-10-02 ENCOUNTER — Other Ambulatory Visit: Payer: Self-pay

## 2023-10-02 ENCOUNTER — Encounter: Payer: Self-pay | Admitting: Pharmacist

## 2023-10-02 ENCOUNTER — Other Ambulatory Visit (HOSPITAL_COMMUNITY): Payer: Self-pay

## 2023-10-02 LAB — CBC WITH DIFFERENTIAL/PLATELET
Basophils Absolute: 0.1 10*3/uL (ref 0.0–0.2)
Basos: 1 %
EOS (ABSOLUTE): 0.2 10*3/uL (ref 0.0–0.4)
Eos: 4 %
Hematocrit: 46.7 % — ABNORMAL HIGH (ref 34.0–46.6)
Hemoglobin: 15.4 g/dL (ref 11.1–15.9)
Immature Grans (Abs): 0 10*3/uL (ref 0.0–0.1)
Immature Granulocytes: 0 %
Lymphocytes Absolute: 2.3 10*3/uL (ref 0.7–3.1)
Lymphs: 33 %
MCH: 30.4 pg (ref 26.6–33.0)
MCHC: 33 g/dL (ref 31.5–35.7)
MCV: 92 fL (ref 79–97)
Monocytes Absolute: 0.5 10*3/uL (ref 0.1–0.9)
Monocytes: 8 %
Neutrophils Absolute: 3.8 10*3/uL (ref 1.4–7.0)
Neutrophils: 54 %
Platelets: 234 10*3/uL (ref 150–450)
RBC: 5.07 x10E6/uL (ref 3.77–5.28)
RDW: 12.5 % (ref 11.7–15.4)
WBC: 6.9 10*3/uL (ref 3.4–10.8)

## 2023-10-02 LAB — CMP14+EGFR
ALT: 19 [IU]/L (ref 0–32)
AST: 21 [IU]/L (ref 0–40)
Albumin: 4.6 g/dL (ref 3.8–4.9)
Alkaline Phosphatase: 86 IU/L (ref 44–121)
BUN/Creatinine Ratio: 6 — ABNORMAL LOW (ref 9–23)
BUN: 6 mg/dL (ref 6–24)
Bilirubin Total: 0.5 mg/dL (ref 0.0–1.2)
CO2: 23 mmol/L (ref 20–29)
Calcium: 8.9 mg/dL (ref 8.7–10.2)
Chloride: 101 mmol/L (ref 96–106)
Creatinine, Ser: 0.95 mg/dL (ref 0.57–1.00)
Globulin, Total: 2.7 g/dL (ref 1.5–4.5)
Glucose: 91 mg/dL (ref 70–99)
Potassium: 3.6 mmol/L (ref 3.5–5.2)
Sodium: 138 mmol/L (ref 134–144)
Total Protein: 7.3 g/dL (ref 6.0–8.5)
eGFR: 72 mL/min/{1.73_m2} (ref >59)

## 2023-10-02 LAB — VITAMIN D 25 HYDROXY (VIT D DEFICIENCY, FRACTURES): Vit D, 25-Hydroxy: 28.2 ng/mL — ABNORMAL LOW (ref 30.0–100.0)

## 2023-10-02 LAB — TSH: TSH: 0.171 u[IU]/mL — ABNORMAL LOW (ref 0.450–4.500)

## 2023-10-02 LAB — CELIAC DISEASE PANEL
Endomysial IgA: NEGATIVE
IgA/Immunoglobulin A, Serum: 379 mg/dL — ABNORMAL HIGH (ref 87–352)
Transglutaminase IgA: <2 U/mL (ref 0–3)

## 2023-10-02 LAB — LIPASE: Lipase: 25 U/L (ref 14–72)

## 2023-10-02 LAB — HEMOGLOBIN A1C
Est. average glucose Bld gHb Est-mCnc: 114 mg/dL
Hgb A1c MFr Bld: 5.6 % (ref 4.8–5.6)

## 2023-10-02 MED ORDER — AMPHETAMINE-DEXTROAMPHETAMINE 10 MG PO TABS
10.0000 mg | ORAL_TABLET | Freq: Every day | ORAL | 0 refills | Status: DC
  Filled 2023-10-02: qty 30, 30d supply, fill #0

## 2023-10-02 MED ORDER — LISDEXAMFETAMINE DIMESYLATE 70 MG PO CAPS
70.0000 mg | ORAL_CAPSULE | Freq: Every day | ORAL | 0 refills | Status: DC
  Filled 2023-11-03: qty 30, 30d supply, fill #0

## 2023-10-02 MED ORDER — AMPHETAMINE-DEXTROAMPHETAMINE 10 MG PO TABS
10.0000 mg | ORAL_TABLET | Freq: Every day | ORAL | 0 refills | Status: DC
  Filled 2023-12-05: qty 30, 30d supply, fill #0

## 2023-10-02 MED ORDER — LISDEXAMFETAMINE DIMESYLATE 70 MG PO CAPS
70.0000 mg | ORAL_CAPSULE | Freq: Every day | ORAL | 0 refills | Status: DC
  Filled 2023-10-02 – 2023-12-11 (×5): qty 30, 30d supply, fill #0

## 2023-10-02 MED ORDER — AMPHETAMINE-DEXTROAMPHETAMINE 10 MG PO TABS
10.0000 mg | ORAL_TABLET | Freq: Every day | ORAL | 0 refills | Status: DC
  Filled 2023-11-03: qty 30, 30d supply, fill #0

## 2023-10-02 MED ORDER — LISDEXAMFETAMINE DIMESYLATE 70 MG PO CAPS
70.0000 mg | ORAL_CAPSULE | Freq: Every day | ORAL | 0 refills | Status: DC
  Filled 2023-12-05: qty 30, 30d supply, fill #0

## 2023-10-02 NOTE — Progress Notes (Signed)
Michelle Browning,   Vitamin D is a little better. Increase by another 1000units daily and take with dairy.  Kidney and liver look good.  A1C improved to 5.6 just out of pre-diabetes range. Continue to work on low sugar diet and exercise.  TSH appears a little too suppressed. So at a little too much and at not quite enough. It could be the weight loss as well. Lets go back down to . Are you ok with this?   Celiac panel negative but you do have a few antibodies to IGA.

## 2023-10-02 NOTE — Telephone Encounter (Signed)
Can we help her with this form?

## 2023-10-24 ENCOUNTER — Other Ambulatory Visit (HOSPITAL_COMMUNITY): Payer: 59 | Attending: Oncology

## 2023-10-30 ENCOUNTER — Other Ambulatory Visit: Payer: Self-pay

## 2023-11-03 ENCOUNTER — Other Ambulatory Visit (HOSPITAL_COMMUNITY): Payer: Self-pay

## 2023-11-03 ENCOUNTER — Other Ambulatory Visit: Payer: Self-pay

## 2023-11-03 ENCOUNTER — Other Ambulatory Visit: Payer: Self-pay | Admitting: Physician Assistant

## 2023-11-03 DIAGNOSIS — F900 Attention-deficit hyperactivity disorder, predominantly inattentive type: Secondary | ICD-10-CM

## 2023-11-27 ENCOUNTER — Other Ambulatory Visit: Payer: Self-pay | Admitting: Physician Assistant

## 2023-11-27 ENCOUNTER — Other Ambulatory Visit: Payer: Self-pay

## 2023-11-27 ENCOUNTER — Other Ambulatory Visit (HOSPITAL_COMMUNITY): Payer: Self-pay

## 2023-11-27 DIAGNOSIS — F900 Attention-deficit hyperactivity disorder, predominantly inattentive type: Secondary | ICD-10-CM

## 2023-11-27 DIAGNOSIS — F330 Major depressive disorder, recurrent, mild: Secondary | ICD-10-CM

## 2023-11-27 DIAGNOSIS — F32A Depression, unspecified: Secondary | ICD-10-CM

## 2023-11-27 DIAGNOSIS — F422 Mixed obsessional thoughts and acts: Secondary | ICD-10-CM

## 2023-11-27 MED ORDER — AMPHETAMINE-DEXTROAMPHETAMINE 10 MG PO TABS
10.0000 mg | ORAL_TABLET | Freq: Every day | ORAL | 0 refills | Status: DC
  Filled 2023-11-27 – 2023-12-11 (×3): qty 30, 30d supply, fill #0

## 2023-11-27 MED ORDER — LISDEXAMFETAMINE DIMESYLATE 70 MG PO CAPS
70.0000 mg | ORAL_CAPSULE | Freq: Every day | ORAL | 0 refills | Status: DC
  Filled 2024-01-01: qty 30, 30d supply, fill #0

## 2023-11-27 MED ORDER — LISDEXAMFETAMINE DIMESYLATE 70 MG PO CAPS
70.0000 mg | ORAL_CAPSULE | Freq: Every day | ORAL | 0 refills | Status: DC
  Filled 2024-01-26 – 2024-03-11 (×2): qty 30, 30d supply, fill #0

## 2023-11-27 MED ORDER — LISDEXAMFETAMINE DIMESYLATE 70 MG PO CAPS
70.0000 mg | ORAL_CAPSULE | Freq: Every day | ORAL | 0 refills | Status: DC
  Filled 2023-11-27 – 2023-12-11 (×3): qty 30, 30d supply, fill #0

## 2023-11-27 MED ORDER — AMPHETAMINE-DEXTROAMPHETAMINE 10 MG PO TABS: 10.0000 mg | ORAL_TABLET | Freq: Every day | ORAL | 0 refills | Status: DC

## 2023-11-27 MED ORDER — AMPHETAMINE-DEXTROAMPHETAMINE 10 MG PO TABS
10.0000 mg | ORAL_TABLET | Freq: Every day | ORAL | 0 refills | Status: DC
  Filled 2024-01-26: qty 30, 30d supply, fill #0

## 2023-11-27 MED ORDER — BUPROPION HCL ER (XL) 300 MG PO TB24
300.0000 mg | ORAL_TABLET | Freq: Every day | ORAL | 3 refills | Status: AC
  Filled 2023-11-27 – 2024-01-23 (×5): qty 90, 90d supply, fill #0
  Filled 2024-01-26 – 2024-04-30 (×3): qty 90, 90d supply, fill #1
  Filled 2024-05-13 – 2024-07-22 (×5): qty 90, 90d supply, fill #2
  Filled 2024-08-07 – 2024-10-17 (×4): qty 90, 90d supply, fill #3

## 2023-11-28 ENCOUNTER — Other Ambulatory Visit: Payer: Self-pay

## 2023-12-05 ENCOUNTER — Other Ambulatory Visit (HOSPITAL_COMMUNITY): Payer: Self-pay

## 2023-12-05 ENCOUNTER — Other Ambulatory Visit: Payer: Self-pay

## 2023-12-11 ENCOUNTER — Other Ambulatory Visit: Payer: Self-pay | Admitting: Physician Assistant

## 2023-12-11 ENCOUNTER — Encounter: Payer: Self-pay | Admitting: Physician Assistant

## 2023-12-11 ENCOUNTER — Other Ambulatory Visit: Payer: Self-pay

## 2023-12-11 DIAGNOSIS — F900 Attention-deficit hyperactivity disorder, predominantly inattentive type: Secondary | ICD-10-CM

## 2023-12-13 NOTE — Telephone Encounter (Signed)
 Duplicate refill request.

## 2023-12-15 ENCOUNTER — Other Ambulatory Visit (HOSPITAL_COMMUNITY): Payer: Self-pay

## 2023-12-15 ENCOUNTER — Other Ambulatory Visit: Payer: Self-pay

## 2023-12-15 MED ORDER — LISDEXAMFETAMINE DIMESYLATE 70 MG PO CAPS
70.0000 mg | ORAL_CAPSULE | Freq: Every day | ORAL | 0 refills | Status: DC
  Filled 2023-12-15: qty 30, 30d supply, fill #0

## 2023-12-15 MED ORDER — AMPHETAMINE-DEXTROAMPHETAMINE 10 MG PO TABS
10.0000 mg | ORAL_TABLET | Freq: Every day | ORAL | 0 refills | Status: DC
  Filled 2023-12-15: qty 30, 30d supply, fill #0

## 2023-12-18 ENCOUNTER — Encounter: Payer: Self-pay | Admitting: Physician Assistant

## 2023-12-21 ENCOUNTER — Encounter: Payer: Self-pay | Admitting: Physician Assistant

## 2024-01-01 ENCOUNTER — Encounter: Payer: Self-pay | Admitting: Physician Assistant

## 2024-01-01 ENCOUNTER — Other Ambulatory Visit (HOSPITAL_COMMUNITY): Payer: Self-pay

## 2024-01-01 ENCOUNTER — Ambulatory Visit (INDEPENDENT_AMBULATORY_CARE_PROVIDER_SITE_OTHER): Payer: Commercial Managed Care - PPO | Admitting: Physician Assistant

## 2024-01-01 ENCOUNTER — Other Ambulatory Visit: Payer: Self-pay

## 2024-01-01 VITALS — BP 122/78 | HR 74 | Ht 66.0 in | Wt 180.2 lb

## 2024-01-01 DIAGNOSIS — F900 Attention-deficit hyperactivity disorder, predominantly inattentive type: Secondary | ICD-10-CM | POA: Diagnosis not present

## 2024-01-01 DIAGNOSIS — F32A Depression, unspecified: Secondary | ICD-10-CM

## 2024-01-01 DIAGNOSIS — F419 Anxiety disorder, unspecified: Secondary | ICD-10-CM | POA: Diagnosis not present

## 2024-01-01 DIAGNOSIS — E039 Hypothyroidism, unspecified: Secondary | ICD-10-CM | POA: Diagnosis not present

## 2024-01-01 MED ORDER — LEVOTHYROXINE SODIUM 112 MCG PO TABS
112.0000 ug | ORAL_TABLET | Freq: Every day | ORAL | 1 refills | Status: DC
  Filled 2024-01-01 – 2024-03-11 (×5): qty 90, 90d supply, fill #0
  Filled 2024-04-30 – 2024-05-26 (×3): qty 90, 90d supply, fill #1

## 2024-01-01 MED ORDER — LISDEXAMFETAMINE DIMESYLATE 70 MG PO CAPS
70.0000 mg | ORAL_CAPSULE | Freq: Every day | ORAL | 0 refills | Status: DC
  Filled 2024-04-12 – 2024-05-13 (×3): qty 30, 30d supply, fill #0

## 2024-01-01 MED ORDER — LISDEXAMFETAMINE DIMESYLATE 70 MG PO CAPS
70.0000 mg | ORAL_CAPSULE | Freq: Every day | ORAL | 0 refills | Status: DC
  Filled 2024-03-11 – 2024-04-12 (×2): qty 30, 30d supply, fill #0

## 2024-01-01 MED ORDER — AMPHETAMINE-DEXTROAMPHETAMINE 10 MG PO TABS
10.0000 mg | ORAL_TABLET | Freq: Every day | ORAL | 0 refills | Status: DC
  Filled 2024-04-30 – 2024-05-13 (×2): qty 30, 30d supply, fill #0

## 2024-01-01 MED ORDER — LISDEXAMFETAMINE DIMESYLATE 70 MG PO CAPS
70.0000 mg | ORAL_CAPSULE | Freq: Every day | ORAL | 0 refills | Status: DC
  Filled 2024-04-30 – 2024-06-17 (×4): qty 30, 30d supply, fill #0

## 2024-01-01 MED ORDER — AMPHETAMINE-DEXTROAMPHETAMINE 10 MG PO TABS
10.0000 mg | ORAL_TABLET | Freq: Every day | ORAL | 0 refills | Status: DC
  Filled 2024-03-11: qty 30, 30d supply, fill #0

## 2024-01-01 MED ORDER — AMPHETAMINE-DEXTROAMPHETAMINE 10 MG PO TABS
10.0000 mg | ORAL_TABLET | Freq: Every day | ORAL | 0 refills | Status: DC
  Filled 2024-04-12: qty 30, 30d supply, fill #0

## 2024-01-01 NOTE — Progress Notes (Signed)
Established Patient Office Visit  Subjective   Patient ID: Michelle Browning, female    DOB: 21-Feb-1971  Age: 53 y.o. MRN: [id number]  Chief Complaint  Patient presents with   ADHD    Follow up and rx rf =kph    HPI Pt is a 53 yo female who presents to the clinic for medication refill and follow up. Pt is doing great! She has no concerns with focus or mood. She is doing well at work. She needs refills.   .. Active Ambulatory Problems    Diagnosis Date Noted   Thyroid cancer (HCC) 02/07/2012   Anxiety and depression 02/07/2012   Fatigue 02/07/2012   Hypothyroidism 02/20/2013   Hyperlipidemia 05/26/2014   Decreased thyroid stimulating hormone level 06/11/2014   Overweight (BMI 25.0-29.9) 03/29/2015   Class 1 obesity due to excess calories without serious comorbidity with body mass index (BMI) of 30.0 to 30.9 in adult 12/25/2015   Inattention 12/25/2015   OCD (obsessive compulsive disorder) 01/27/2016   Attention deficit hyperactivity disorder (ADHD) 01/27/2016   MDD (major depressive disorder), recurrent episode, mild (HCC) 09/11/2017   Elevated LDL cholesterol level 08/11/2020   Tachycardia 06/02/2022   Pre-diabetes 03/22/2023   Menopausal syndrome (hot flashes) 09/29/2023   Nausea and vomiting 09/29/2023   Resolved Ambulatory Problems    Diagnosis Date Noted   Abnormal weight gain 12/25/2015   Past Medical History:  Diagnosis Date   Cancer (HCC) 2002   Depression    GERD (gastroesophageal reflux disease)    Personal history of radiation therapy 2003   Thyroid disease     Review of Systems  All other systems reviewed and are negative.     Objective:     BP 122/78   Pulse 74   Ht 5\' 6"  (1.676 m)   Wt 180 lb 4 oz (81.8 kg)   SpO2 98%   BMI 29.09 kg/m  BP Readings from Last 3 Encounters:  01/01/24 122/78  09/29/23 (!) 140/82  06/13/23 130/79   Wt Readings from Last 3 Encounters:  01/01/24 180 lb 4 oz (81.8 kg)  09/29/23 185 lb 12 oz (84.3 kg)   06/13/23 195 lb (88.5 kg)    ..    01/01/2024    8:35 AM 09/29/2023   11:10 AM 06/13/2023    3:56 PM 03/14/2023    3:10 PM 12/12/2022    2:14 PM  Depression screen PHQ 2/9  Decreased Interest 0 0 0 0 0  Down, Depressed, Hopeless 0  0 0 0  PHQ - 2 Score 0 0 0 0 0  Altered sleeping 0 0 0  0  Tired, decreased energy 0 0 0  0  Change in appetite 0 0 0  0  Feeling bad or failure about yourself  0 0 0  0  Trouble concentrating 0 0 0  1  Moving slowly or fidgety/restless 0 0 0  0  Suicidal thoughts 0 0 0  0  PHQ-9 Score 0 0 0  1  Difficult doing work/chores Not difficult at all Not difficult at all Not difficult at all  Not difficult at all   .Marland Kitchen    01/01/2024    8:36 AM 09/29/2023   11:10 AM 06/13/2023    3:56 PM 12/12/2022    2:14 PM  GAD 7 : Generalized Anxiety Score  Nervous, Anxious, on Edge 0 0 0 1  Control/stop worrying 0 0 0 1  Worry too much - different things 0 0 0 1  Trouble relaxing 0 0 0 1  Restless 0 0 0 0  Easily annoyed or irritable 0 0 0 3  Afraid - awful might happen 0 0 0 1  Total GAD 7 Score 0 0 0 8  Anxiety Difficulty Not difficult at all Not difficult at all Not difficult at all Somewhat difficult      Physical Exam Constitutional:      Appearance: Normal appearance.  HENT:     Head: Normocephalic.  Cardiovascular:     Rate and Rhythm: Normal rate and regular rhythm.  Pulmonary:     Effort: Pulmonary effort is normal.     Breath sounds: Normal breath sounds.  Musculoskeletal:     Right lower leg: No edema.     Left lower leg: No edema.  Neurological:     General: No focal deficit present.     Mental Status: She is alert and oriented to person, place, and time.  Psychiatric:        Mood and Affect: Mood normal.         Assessment & Plan:  .Marland KitchenKirby was seen today for adhd.  Diagnoses and all orders for this visit:  Attention deficit hyperactivity disorder (ADHD), predominantly inattentive type -     lisdexamfetamine (VYVANSE) 70 MG capsule;  Take 1 capsule (70 mg total) by mouth daily. -     lisdexamfetamine (VYVANSE) 70 MG capsule; Take 1 capsule (70 mg total) by mouth daily. -     lisdexamfetamine (VYVANSE) 70 MG capsule; Take 1 capsule (70 mg total) by mouth daily. -     amphetamine-dextroamphetamine (ADDERALL) 10 MG tablet; Take 1 tablet (10 mg total) by mouth daily. -     amphetamine-dextroamphetamine (ADDERALL) 10 MG tablet; Take 1 tablet (10 mg total) by mouth daily. -     amphetamine-dextroamphetamine (ADDERALL) 10 MG tablet; Take 1 tablet (10 mg total) by mouth daily at 1pm.  Acquired hypothyroidism -     levothyroxine (SYNTHROID) 112 MCG tablet; Take 1 tablet (112 mcg total) by mouth daily before breakfast.  Anxiety and depression   Pt reports to be doing well and PHQ/GAD look wonderful Vitals look great Refilled medications Follow up in 4 months   Tandy Gaw, PA-C

## 2024-01-23 ENCOUNTER — Other Ambulatory Visit: Payer: Self-pay | Admitting: Physician Assistant

## 2024-01-23 ENCOUNTER — Other Ambulatory Visit (HOSPITAL_COMMUNITY): Payer: Self-pay

## 2024-01-23 ENCOUNTER — Other Ambulatory Visit: Payer: Self-pay

## 2024-01-23 DIAGNOSIS — F900 Attention-deficit hyperactivity disorder, predominantly inattentive type: Secondary | ICD-10-CM

## 2024-01-23 DIAGNOSIS — R112 Nausea with vomiting, unspecified: Secondary | ICD-10-CM

## 2024-01-26 ENCOUNTER — Other Ambulatory Visit: Payer: Self-pay | Admitting: Physician Assistant

## 2024-01-26 ENCOUNTER — Other Ambulatory Visit: Payer: Self-pay

## 2024-01-26 ENCOUNTER — Other Ambulatory Visit (HOSPITAL_COMMUNITY): Payer: Self-pay

## 2024-01-26 DIAGNOSIS — F330 Major depressive disorder, recurrent, mild: Secondary | ICD-10-CM

## 2024-02-01 ENCOUNTER — Other Ambulatory Visit (HOSPITAL_COMMUNITY): Payer: Self-pay

## 2024-02-01 ENCOUNTER — Other Ambulatory Visit: Payer: Self-pay

## 2024-02-01 MED ORDER — OMEPRAZOLE 40 MG PO CPDR
40.0000 mg | DELAYED_RELEASE_CAPSULE | Freq: Every day | ORAL | 1 refills | Status: DC
  Filled 2024-02-01 – 2024-03-13 (×3): qty 90, 90d supply, fill #0
  Filled 2024-05-26 – 2024-06-17 (×2): qty 90, 90d supply, fill #1

## 2024-02-01 MED ORDER — VILAZODONE HCL 40 MG PO TABS
40.0000 mg | ORAL_TABLET | Freq: Every day | ORAL | 3 refills | Status: AC
  Filled 2024-02-01 – 2024-04-30 (×3): qty 90, 90d supply, fill #0
  Filled 2024-05-13 – 2024-07-22 (×5): qty 90, 90d supply, fill #1
  Filled 2024-08-07 – 2024-10-17 (×4): qty 90, 90d supply, fill #2
  Filled 2024-11-20: qty 90, 90d supply, fill #3

## 2024-02-02 ENCOUNTER — Other Ambulatory Visit: Payer: Self-pay

## 2024-02-02 ENCOUNTER — Other Ambulatory Visit (HOSPITAL_COMMUNITY): Payer: Self-pay

## 2024-02-02 MED ORDER — LISDEXAMFETAMINE DIMESYLATE 70 MG PO CAPS
70.0000 mg | ORAL_CAPSULE | Freq: Every day | ORAL | 0 refills | Status: DC
  Filled 2024-02-02: qty 30, 30d supply, fill #0

## 2024-03-11 ENCOUNTER — Other Ambulatory Visit: Payer: Self-pay

## 2024-03-11 ENCOUNTER — Other Ambulatory Visit (HOSPITAL_COMMUNITY): Payer: Self-pay

## 2024-03-11 ENCOUNTER — Other Ambulatory Visit: Payer: Self-pay | Admitting: Physician Assistant

## 2024-03-11 DIAGNOSIS — F900 Attention-deficit hyperactivity disorder, predominantly inattentive type: Secondary | ICD-10-CM

## 2024-03-11 MED ORDER — AMPHETAMINE-DEXTROAMPHETAMINE 10 MG PO TABS
10.0000 mg | ORAL_TABLET | Freq: Every day | ORAL | 0 refills | Status: DC
  Filled 2024-04-12 – 2024-09-02 (×9): qty 30, 30d supply, fill #0

## 2024-03-11 MED ORDER — LISDEXAMFETAMINE DIMESYLATE 70 MG PO CAPS
70.0000 mg | ORAL_CAPSULE | Freq: Every day | ORAL | 0 refills | Status: DC
  Filled 2024-05-13 – 2024-09-02 (×7): qty 30, 30d supply, fill #0

## 2024-03-11 MED ORDER — AMPHETAMINE-DEXTROAMPHETAMINE 10 MG PO TABS
10.0000 mg | ORAL_TABLET | Freq: Every day | ORAL | 0 refills | Status: DC
  Filled 2024-03-11 – 2024-06-17 (×6): qty 30, 30d supply, fill #0

## 2024-03-11 MED ORDER — AMPHETAMINE-DEXTROAMPHETAMINE 10 MG PO TABS
10.0000 mg | ORAL_TABLET | Freq: Every day | ORAL | 0 refills | Status: DC
  Filled 2024-05-13 – 2024-09-02 (×7): qty 30, 30d supply, fill #0

## 2024-03-11 MED ORDER — LISDEXAMFETAMINE DIMESYLATE 70 MG PO CAPS
70.0000 mg | ORAL_CAPSULE | Freq: Every day | ORAL | 0 refills | Status: DC
  Filled 2024-03-11 – 2024-07-22 (×8): qty 30, 30d supply, fill #0

## 2024-03-11 MED ORDER — LISDEXAMFETAMINE DIMESYLATE 70 MG PO CAPS
70.0000 mg | ORAL_CAPSULE | Freq: Every day | ORAL | 0 refills | Status: DC
  Filled 2024-04-12 – 2024-09-02 (×9): qty 30, 30d supply, fill #0

## 2024-03-13 ENCOUNTER — Other Ambulatory Visit: Payer: Self-pay

## 2024-03-13 ENCOUNTER — Other Ambulatory Visit (HOSPITAL_COMMUNITY): Payer: Self-pay

## 2024-04-01 ENCOUNTER — Encounter: Payer: Self-pay | Admitting: Physician Assistant

## 2024-04-01 ENCOUNTER — Ambulatory Visit: Payer: Commercial Managed Care - PPO | Admitting: Physician Assistant

## 2024-04-01 VITALS — BP 129/79 | HR 76 | Ht 66.0 in | Wt 170.0 lb

## 2024-04-01 DIAGNOSIS — N951 Menopausal and female climacteric states: Secondary | ICD-10-CM

## 2024-04-01 DIAGNOSIS — F419 Anxiety disorder, unspecified: Secondary | ICD-10-CM | POA: Diagnosis not present

## 2024-04-01 DIAGNOSIS — F422 Mixed obsessional thoughts and acts: Secondary | ICD-10-CM

## 2024-04-01 DIAGNOSIS — Z79899 Other long term (current) drug therapy: Secondary | ICD-10-CM | POA: Diagnosis not present

## 2024-04-01 DIAGNOSIS — F900 Attention-deficit hyperactivity disorder, predominantly inattentive type: Secondary | ICD-10-CM | POA: Diagnosis not present

## 2024-04-01 DIAGNOSIS — F32A Depression, unspecified: Secondary | ICD-10-CM

## 2024-04-02 ENCOUNTER — Encounter: Payer: Self-pay | Admitting: Physician Assistant

## 2024-04-02 NOTE — Progress Notes (Signed)
 Established Patient Office Visit  Subjective   Patient ID: Michelle Browning, female    DOB: [birth year]  Age: 53 y.o. MRN: [id number]  Chief Complaint  Patient presents with   Medical Management of Chronic Issues    3 mo fup on ADHD    HPI Pt is a 53 yo female with ADHD, MDD, hypothyroidism who presents to the clinic for follow up.   Pt is doing great with no concerns. She has had a job change and loves it. She has much less stress. She is doing well with focus and mood. Hot flashes controled with veozah . Pt is very compliant with medication.    Review of Systems  All other systems reviewed and are negative.     Objective:     BP 129/79   Pulse 76   Ht 5\' 6"  (1.676 m)   Wt 170 lb (77.1 kg)   SpO2 99%   BMI 27.44 kg/m  BP Readings from Last 3 Encounters:  04/01/24 129/79  01/01/24 122/78  09/29/23 (!) 140/82   Wt Readings from Last 3 Encounters:  04/01/24 170 lb (77.1 kg)  01/01/24 180 lb 4 oz (81.8 kg)  09/29/23 185 lb 12 oz (84.3 kg)    ..    04/01/2024    9:01 AM 01/01/2024    8:35 AM 09/29/2023   11:10 AM 06/13/2023    3:56 PM 03/14/2023    3:10 PM  Depression screen PHQ 2/9  Decreased Interest 0 0 0 0 0  Down, Depressed, Hopeless 0 0  0 0  PHQ - 2 Score 0 0 0 0 0  Altered sleeping 0 0 0 0   Tired, decreased energy 0 0 0 0   Change in appetite 0 0 0 0   Feeling bad or failure about yourself  0 0 0 0   Trouble concentrating 0 0 0 0   Moving slowly or fidgety/restless 0 0 0 0   Suicidal thoughts 0 0 0 0   PHQ-9 Score 0 0 0 0   Difficult doing work/chores Not difficult at all Not difficult at all Not difficult at all Not difficult at all    ..    04/01/2024    9:01 AM 01/01/2024    8:36 AM 09/29/2023   11:10 AM 06/13/2023    3:56 PM  GAD 7 : Generalized Anxiety Score  Nervous, Anxious, on Edge 0 0 0 0  Control/stop worrying 0 0 0 0  Worry too much - different things 0 0 0 0  Trouble relaxing 0 0 0 0  Restless 0 0 0 0  Easily annoyed or irritable 0  0 0 0  Afraid - awful might happen 0 0 0 0  Total GAD 7 Score 0 0 0 0  Anxiety Difficulty Not difficult at all Not difficult at all Not difficult at all Not difficult at all      Physical Exam Constitutional:      Appearance: Normal appearance.  HENT:     Head: Normocephalic.  Cardiovascular:     Rate and Rhythm: Normal rate and regular rhythm.  Pulmonary:     Effort: Pulmonary effort is normal.     Breath sounds: Normal breath sounds.  Neurological:     General: No focal deficit present.     Mental Status: She is alert and oriented to person, place, and time.  Psychiatric:        Mood and Affect: Mood normal.  Assessment & Plan:  .Michelle Browning was seen today for medical management of chronic issues.  Diagnoses and all orders for this visit:  Attention deficit hyperactivity disorder (ADHD), predominantly inattentive type  Anxiety and depression  Mixed obsessional thoughts and acts  Hot flashes due to menopause -     CMP14+EGFR  Medication management -     CMP14+EGFR   She does not need refills of anything right now but will keep her refilled for 6 months.  She does need to check liver enzymes since starting veozah .  Cmp ordered today.  PHQ/GAD no concerns.  Vitals look great.     Return in about 6 months (around 10/01/2024).    Davarious Tumbleson, PA-C

## 2024-04-12 ENCOUNTER — Other Ambulatory Visit: Payer: Self-pay

## 2024-04-12 ENCOUNTER — Other Ambulatory Visit: Payer: Self-pay | Admitting: Physician Assistant

## 2024-04-12 ENCOUNTER — Other Ambulatory Visit (HOSPITAL_COMMUNITY): Payer: Self-pay

## 2024-04-12 DIAGNOSIS — F900 Attention-deficit hyperactivity disorder, predominantly inattentive type: Secondary | ICD-10-CM

## 2024-04-19 ENCOUNTER — Other Ambulatory Visit: Payer: Self-pay

## 2024-04-19 ENCOUNTER — Ambulatory Visit

## 2024-04-19 VITALS — BP 128/90 | HR 105 | Ht 66.0 in | Wt 166.0 lb

## 2024-04-19 DIAGNOSIS — R350 Frequency of micturition: Secondary | ICD-10-CM | POA: Diagnosis not present

## 2024-04-19 DIAGNOSIS — N951 Menopausal and female climacteric states: Secondary | ICD-10-CM | POA: Diagnosis not present

## 2024-04-19 DIAGNOSIS — Z79899 Other long term (current) drug therapy: Secondary | ICD-10-CM | POA: Diagnosis not present

## 2024-04-19 LAB — POCT URINALYSIS DIP (CLINITEK)
Glucose, UA: NEGATIVE mg/dL
Leukocytes, UA: NEGATIVE
Nitrite, UA: NEGATIVE
Spec Grav, UA: >=1.03 — AB (ref 1.010–1.025)
Urobilinogen, UA: 0.2 U/dL
pH, UA: 5.5 (ref 5.0–8.0)

## 2024-04-19 MED ORDER — PHENAZOPYRIDINE HCL 200 MG PO TABS
200.0000 mg | ORAL_TABLET | Freq: Three times a day (TID) | ORAL | 0 refills | Status: AC
  Filled 2024-04-19: qty 6, 2d supply, fill #0

## 2024-04-19 NOTE — Progress Notes (Signed)
 Pt presents with urinary frequency and uterine tenderness x 4 days. Drank cranberry juice. No OTC meds.  Completed POC urinalysis = Abnormal; sent for urine culture.  Per Lindaann Requena: Pyridium Rx sent to pharmacy until culture results. Pt agreed to treatment plan.

## 2024-04-19 NOTE — Progress Notes (Signed)
 Patient advised.

## 2024-04-20 ENCOUNTER — Encounter: Payer: Self-pay | Admitting: Physician Assistant

## 2024-04-20 LAB — CMP14+EGFR
ALT: 20 IU/L (ref 0–32)
AST: 21 IU/L (ref 0–40)
Albumin: 4.7 g/dL (ref 3.8–4.9)
Alkaline Phosphatase: 93 IU/L (ref 44–121)
BUN/Creatinine Ratio: 9 (ref 9–23)
BUN: 10 mg/dL (ref 6–24)
Bilirubin Total: 0.2 mg/dL (ref 0.0–1.2)
CO2: 19 mmol/L — ABNORMAL LOW (ref 20–29)
Calcium: 9 mg/dL (ref 8.7–10.2)
Chloride: 101 mmol/L (ref 96–106)
Creatinine, Ser: 1.12 mg/dL — ABNORMAL HIGH (ref 0.57–1.00)
Globulin, Total: 2.7 g/dL (ref 1.5–4.5)
Glucose: 105 mg/dL — ABNORMAL HIGH (ref 70–99)
Potassium: 3.8 mmol/L (ref 3.5–5.2)
Sodium: 141 mmol/L (ref 134–144)
Total Protein: 7.4 g/dL (ref 6.0–8.5)
eGFR: 59 mL/min/{1.73_m2} — ABNORMAL LOW (ref >59)

## 2024-04-21 LAB — URINE CULTURE

## 2024-04-22 ENCOUNTER — Ambulatory Visit: Payer: Self-pay | Admitting: Physician Assistant

## 2024-04-22 ENCOUNTER — Other Ambulatory Visit: Payer: Self-pay

## 2024-04-22 NOTE — Progress Notes (Signed)
 Kidney function decreased some from 6 months ago. Avoid regular use of anti-inflammatories. Make sure drinking plenty of water and staying hydrated. Recheck in 3-6 months.  Liver enzymes look great.

## 2024-04-22 NOTE — Progress Notes (Signed)
 No abnormal bacteria found in urine culture. How are you feeling?

## 2024-04-30 ENCOUNTER — Other Ambulatory Visit: Payer: Self-pay | Admitting: Physician Assistant

## 2024-04-30 ENCOUNTER — Other Ambulatory Visit: Payer: Self-pay

## 2024-04-30 ENCOUNTER — Other Ambulatory Visit (HOSPITAL_COMMUNITY): Payer: Self-pay

## 2024-04-30 DIAGNOSIS — F900 Attention-deficit hyperactivity disorder, predominantly inattentive type: Secondary | ICD-10-CM

## 2024-05-01 ENCOUNTER — Other Ambulatory Visit (HOSPITAL_COMMUNITY): Payer: Self-pay

## 2024-05-01 MED ORDER — LISDEXAMFETAMINE DIMESYLATE 70 MG PO CAPS
70.0000 mg | ORAL_CAPSULE | Freq: Every day | ORAL | 0 refills | Status: AC
  Filled 2024-05-01 – 2024-10-17 (×10): qty 30, 30d supply, fill #0

## 2024-05-01 MED ORDER — AMPHETAMINE-DEXTROAMPHETAMINE 10 MG PO TABS
10.0000 mg | ORAL_TABLET | Freq: Every day | ORAL | 0 refills | Status: AC
  Filled 2024-05-01 – 2024-10-17 (×10): qty 30, 30d supply, fill #0

## 2024-05-06 ENCOUNTER — Other Ambulatory Visit (HOSPITAL_COMMUNITY): Payer: Self-pay

## 2024-05-10 ENCOUNTER — Other Ambulatory Visit (HOSPITAL_COMMUNITY): Payer: Self-pay

## 2024-05-13 ENCOUNTER — Other Ambulatory Visit: Payer: Self-pay | Admitting: Physician Assistant

## 2024-05-13 ENCOUNTER — Other Ambulatory Visit: Payer: Self-pay

## 2024-05-13 DIAGNOSIS — F900 Attention-deficit hyperactivity disorder, predominantly inattentive type: Secondary | ICD-10-CM

## 2024-05-26 ENCOUNTER — Other Ambulatory Visit: Payer: Self-pay | Admitting: Physician Assistant

## 2024-05-26 DIAGNOSIS — F900 Attention-deficit hyperactivity disorder, predominantly inattentive type: Secondary | ICD-10-CM

## 2024-05-27 ENCOUNTER — Other Ambulatory Visit (HOSPITAL_COMMUNITY): Payer: Self-pay

## 2024-05-27 ENCOUNTER — Other Ambulatory Visit: Payer: Self-pay

## 2024-05-27 MED ORDER — LISDEXAMFETAMINE DIMESYLATE 70 MG PO CAPS
70.0000 mg | ORAL_CAPSULE | Freq: Every day | ORAL | 0 refills | Status: AC
  Filled 2024-05-27 – 2024-11-20 (×9): qty 30, 30d supply, fill #0

## 2024-05-27 MED ORDER — AMPHETAMINE-DEXTROAMPHETAMINE 10 MG PO TABS
10.0000 mg | ORAL_TABLET | Freq: Every day | ORAL | 0 refills | Status: AC
  Filled 2024-05-27 – 2024-11-20 (×9): qty 30, 30d supply, fill #0

## 2024-05-27 MED ORDER — LISDEXAMFETAMINE DIMESYLATE 70 MG PO CAPS
70.0000 mg | ORAL_CAPSULE | Freq: Every day | ORAL | 0 refills | Status: AC
Start: 2024-06-26 — End: ?
  Filled 2024-07-05 – 2024-11-20 (×7): qty 30, 30d supply, fill #0

## 2024-05-27 MED ORDER — AMPHETAMINE-DEXTROAMPHETAMINE 10 MG PO TABS
10.0000 mg | ORAL_TABLET | Freq: Every day | ORAL | 0 refills | Status: DC
Start: 2024-06-26 — End: 2024-08-07
  Filled 2024-07-05 – 2024-07-23 (×3): qty 30, 30d supply, fill #0

## 2024-05-27 NOTE — Telephone Encounter (Signed)
 Please advise on refill request

## 2024-05-28 ENCOUNTER — Other Ambulatory Visit: Payer: Self-pay | Admitting: Medical Genetics

## 2024-05-28 DIAGNOSIS — Z006 Encounter for examination for normal comparison and control in clinical research program: Secondary | ICD-10-CM

## 2024-06-07 LAB — GENECONNECT MOLECULAR SCREEN: Genetic Analysis Overall Interpretation: NEGATIVE

## 2024-06-17 ENCOUNTER — Other Ambulatory Visit: Payer: Self-pay

## 2024-06-17 ENCOUNTER — Other Ambulatory Visit (HOSPITAL_COMMUNITY): Payer: Self-pay

## 2024-06-17 ENCOUNTER — Other Ambulatory Visit: Payer: Self-pay | Admitting: Physician Assistant

## 2024-06-17 DIAGNOSIS — E039 Hypothyroidism, unspecified: Secondary | ICD-10-CM

## 2024-06-25 ENCOUNTER — Other Ambulatory Visit: Payer: Self-pay

## 2024-06-25 ENCOUNTER — Other Ambulatory Visit (HOSPITAL_COMMUNITY): Payer: Self-pay

## 2024-06-25 MED ORDER — LEVOTHYROXINE SODIUM 112 MCG PO TABS
112.0000 ug | ORAL_TABLET | Freq: Every day | ORAL | 1 refills | Status: AC
  Filled 2024-06-25 – 2024-09-02 (×5): qty 90, 90d supply, fill #0
  Filled 2024-09-18 – 2024-11-20 (×3): qty 90, 90d supply, fill #1

## 2024-06-26 ENCOUNTER — Other Ambulatory Visit (HOSPITAL_COMMUNITY): Payer: Self-pay

## 2024-07-04 ENCOUNTER — Ambulatory Visit: Payer: 59 | Admitting: Dermatology

## 2024-07-04 DIAGNOSIS — D229 Melanocytic nevi, unspecified: Secondary | ICD-10-CM

## 2024-07-04 DIAGNOSIS — W908XXA Exposure to other nonionizing radiation, initial encounter: Secondary | ICD-10-CM | POA: Diagnosis not present

## 2024-07-04 DIAGNOSIS — L814 Other melanin hyperpigmentation: Secondary | ICD-10-CM

## 2024-07-04 DIAGNOSIS — Z1283 Encounter for screening for malignant neoplasm of skin: Secondary | ICD-10-CM

## 2024-07-04 DIAGNOSIS — D1801 Hemangioma of skin and subcutaneous tissue: Secondary | ICD-10-CM | POA: Diagnosis not present

## 2024-07-04 DIAGNOSIS — L821 Other seborrheic keratosis: Secondary | ICD-10-CM | POA: Diagnosis not present

## 2024-07-04 DIAGNOSIS — L578 Other skin changes due to chronic exposure to nonionizing radiation: Secondary | ICD-10-CM

## 2024-07-04 NOTE — Progress Notes (Signed)
   New Patient Visit   Subjective  Michelle Browning is a 53 y.o. female who presents for the following: Skin Cancer Screening and Full Body Skin Exam  Michelle Browning is here for a FBSE. Last skin check was about 10 years ago. No hx of skin cancer or abnormal moles. Father has hx of melaoma, maternal grandmother and aunt also had melanoma. Spots of concerns are L upper arm, bilateral legs and a spot on upper mid back. Michelle Browning wears sunscreen everyday and and hats when exposed to the sun.   The patient presents for Total-Body Skin Exam (TBSE) for skin cancer screening and mole check. The patient has spots, moles and lesions to be evaluated, some may be new or changing and the patient may have concern these could be cancer.    The following portions of the chart were reviewed this encounter and updated as appropriate: medications, allergies, medical history  Review of Systems:  No other skin or systemic complaints except as noted in HPI or Assessment and Plan.  Objective  Well appearing patient in no apparent distress; mood and affect are within normal limits.  A full examination was performed including scalp, head, eyes, ears, nose, lips, neck, chest, axillae, abdomen, back, buttocks, bilateral upper extremities, bilateral lower extremities, hands, feet, fingers, toes, fingernails, and toenails. All findings within normal limits unless otherwise noted below.   Relevant physical exam findings are noted in the Assessment and Plan.    Assessment & Plan   SKIN CANCER SCREENING PERFORMED TODAY.  ACTINIC DAMAGE - Chronic condition, secondary to cumulative UV/sun exposure - diffuse scaly erythematous macules with underlying dyspigmentation - Recommend daily broad spectrum sunscreen SPF 30+ to sun-exposed areas, reapply every 2 hours as needed.  - Staying in the shade or wearing long sleeves, sun glasses (UVA+UVB protection) and wide brim hats (4-inch brim around the entire circumference of the hat)  are also recommended for sun protection.  - Call for new or changing lesions.  LENTIGINES, SEBORRHEIC KERATOSES, HEMANGIOMAS - Benign normal skin lesions - Benign-appearing - Call for any changes  MELANOCYTIC NEVI- Central mid back 5mm  - Tan-brown and/or pink-flesh-colored symmetric macules and papules - Benign appearing on exam today - Observation - Call clinic for new or changing moles - Recommend daily use of broad spectrum spf 30+ sunscreen to sun-exposed areas.          No follow-ups on file.  I, Gordan Beams, CMA, am acting as scribe for [employer], DO.   Documentation: I have reviewed the above documentation for accuracy and completeness, and I agree with the above.  Delon Lenis, DO

## 2024-07-04 NOTE — Patient Instructions (Signed)
 Date: Thu Jul 04 2024  Hello Joy,  Thank you for visiting today. Here is a summary of the key instructions:  - Sun Protection:   - Apply sunscreen to face, neck, and chest daily   - Pay special attention to chest area when applying sunscreen  - Skin Care:   - Monitor existing moles for changes   - Watch for moles that double in size, get very dark, or become irritated   - Keep an eye on the benign melanocytic nevus on mid-back for any changes  - Cyst Management:   - For cyst on back:     - Do not attempt to remove it     - If it swells, apply a warm compress followed by a warm shower  - Follow-up:   - Return for annual skin check in one year   - Come in sooner if you notice any new or suspicious skin changes  Please reach out if you have any questions or concerns.  Warm regards,  Dr. Delon Lenis, Dermatology    Important Information  Due to recent changes in healthcare laws, you may see results of your pathology and/or laboratory studies on MyChart before the doctors have had a chance to review them. We understand that in some cases there may be results that are confusing or concerning to you. Please understand that not all results are received at the same time and often the doctors may need to interpret multiple results in order to provide you with the best plan of care or course of treatment. Therefore, we ask that you please give us  2 business days to thoroughly review all your results before contacting the office for clarification. Should we see a critical lab result, you will be contacted sooner.   If You Need Anything After Your Visit  If you have any questions or concerns for your doctor, please call our main line at [phone] If no one answers, please leave a voicemail as directed and we will return your call as soon as possible. Messages left after 4 pm will be answered the following business day.   You may also send us  a message via MyChart. We typically  respond to MyChart messages within 1-2 business days.  For prescription refills, please ask your pharmacy to contact our office. Our fax number is [phone].  If you have an urgent issue when the clinic is closed that cannot wait until the next business day, you can page your doctor at the number below.    Please note that while we do our best to be available for urgent issues outside of office hours, we are not available 24/7.   If you have an urgent issue and are unable to reach us , you may choose to seek medical care at your doctor's office, retail clinic, urgent care center, or emergency room.  If you have a medical emergency, please immediately call 911 or go to the emergency department. In the event of inclement weather, please call our main line at [phone] for an update on the status of any delays or closures.  Dermatology Medication Tips: Please keep the boxes that topical medications come in in order to help keep track of the instructions about where and how to use these. Pharmacies typically print the medication instructions only on the boxes and not directly on the medication tubes.   If your medication is too expensive, please contact our office at [phone] or send us  a message through MyChart.  We are unable to tell what your co-pay for medications will be in advance as this is different depending on your insurance coverage. However, we may be able to find a substitute medication at lower cost or fill out paperwork to get insurance to cover a needed medication.   If a prior authorization is required to get your medication covered by your insurance company, please allow us  1-2 business days to complete this process.  Drug prices often vary depending on where the prescription is filled and some pharmacies may offer cheaper prices.  The website www.goodrx.com contains coupons for medications through different pharmacies. The prices here do not account for what the cost  may be with help from insurance (it may be cheaper with your insurance), but the website can give you the price if you did not use any insurance.  - You can print the associated coupon and take it with your prescription to the pharmacy.  - You may also stop by our office during regular business hours and pick up a GoodRx coupon card.  - If you need your prescription sent electronically to a different pharmacy, notify our office through [facility] or by phone at [phone]

## 2024-07-05 ENCOUNTER — Other Ambulatory Visit (HOSPITAL_COMMUNITY): Payer: Self-pay

## 2024-07-05 ENCOUNTER — Other Ambulatory Visit: Payer: Self-pay

## 2024-07-05 ENCOUNTER — Other Ambulatory Visit: Payer: Self-pay | Admitting: Physician Assistant

## 2024-07-05 DIAGNOSIS — F900 Attention-deficit hyperactivity disorder, predominantly inattentive type: Secondary | ICD-10-CM

## 2024-07-05 DIAGNOSIS — R112 Nausea with vomiting, unspecified: Secondary | ICD-10-CM

## 2024-07-09 ENCOUNTER — Encounter: Payer: Self-pay | Admitting: Dermatology

## 2024-07-09 ENCOUNTER — Other Ambulatory Visit (HOSPITAL_COMMUNITY): Payer: Self-pay

## 2024-07-09 MED ORDER — OMEPRAZOLE 40 MG PO CPDR
40.0000 mg | DELAYED_RELEASE_CAPSULE | Freq: Every day | ORAL | 1 refills | Status: AC
  Filled 2024-07-09 – 2024-09-18 (×5): qty 90, 90d supply, fill #0
  Filled 2024-10-17 – 2024-12-13 (×3): qty 90, 90d supply, fill #1

## 2024-07-18 ENCOUNTER — Other Ambulatory Visit (HOSPITAL_BASED_OUTPATIENT_CLINIC_OR_DEPARTMENT_OTHER): Payer: Self-pay

## 2024-07-18 ENCOUNTER — Other Ambulatory Visit (HOSPITAL_COMMUNITY): Payer: Self-pay

## 2024-07-18 ENCOUNTER — Emergency Department (HOSPITAL_BASED_OUTPATIENT_CLINIC_OR_DEPARTMENT_OTHER): Admission: EM | Admit: 2024-07-18 | Discharge: 2024-07-18 | Disposition: A | Discharge status: Home / Self Care

## 2024-07-18 ENCOUNTER — Encounter (HOSPITAL_BASED_OUTPATIENT_CLINIC_OR_DEPARTMENT_OTHER): Payer: Self-pay | Admitting: Emergency Medicine

## 2024-07-18 ENCOUNTER — Other Ambulatory Visit: Payer: Self-pay

## 2024-07-18 DIAGNOSIS — S0501XA Injury of conjunctiva and corneal abrasion without foreign body, right eye, initial encounter: Secondary | ICD-10-CM | POA: Diagnosis not present

## 2024-07-18 DIAGNOSIS — W228XXA Striking against or struck by other objects, initial encounter: Secondary | ICD-10-CM | POA: Diagnosis not present

## 2024-07-18 MED ORDER — IBUPROFEN 400 MG PO TABS
600.0000 mg | ORAL_TABLET | Freq: Once | ORAL | Status: AC
  Administered 2024-07-18: 600 mg via ORAL
  Filled 2024-07-18: qty 1

## 2024-07-18 MED ORDER — ERYTHROMYCIN 5 MG/GM OP OINT
TOPICAL_OINTMENT | OPHTHALMIC | 0 refills | Status: DC
  Filled 2024-07-18: qty 3.5, fill #0
  Filled 2024-07-18: qty 3.5, 10d supply, fill #0

## 2024-07-18 MED ORDER — FLUORESCEIN SODIUM 1 MG OP STRP
1.0000 | ORAL_STRIP | Freq: Once | OPHTHALMIC | Status: AC
  Administered 2024-07-18: 1 via OPHTHALMIC
  Filled 2024-07-18: qty 1

## 2024-07-18 MED ORDER — TETRACAINE HCL 0.5 % OP SOLN
2.0000 [drp] | Freq: Once | OPHTHALMIC | Status: AC
  Administered 2024-07-18: 2 [drp] via OPHTHALMIC
  Filled 2024-07-18: qty 4

## 2024-07-18 NOTE — ED Triage Notes (Addendum)
 States was helping her daughter move inton apt and a box slid and hit her rt eye hurts to move and open Both eyes at this  time unable to do [place] at this time

## 2024-07-18 NOTE — Discharge Instructions (Addendum)
 May take Tylenol  turning with ibuprofen  for pain.  Use the antibiotic ointment for the pain relief and to help prevent infection.  Please follow-up with ophthalmology

## 2024-07-18 NOTE — ED Provider Notes (Signed)
 [place] [facility] Provider Note   CSN: [id number] Arrival date & time: 07/18/24  0745     Patient presents with: Eye Pain   Michelle Browning is a 53 y.o. female.   53 year old female presenting emergency department with right eye pain.  Was moving boxes yesterday afternoon when box slipped and struck her in her right eye.  Has had pain since that time, foreign body sensation.  Tearing.  Does have some sensitivity to light.  No painful EOM.  Does not wear contacts or glasses   Eye Pain       Prior to Admission medications   Medication Sig Start Date End Date Taking? Authorizing Provider  erythromycin  ophthalmic ointment Place a 1/2 inch ribbon of ointment into the lower eyelid three times daily. 07/18/24  Yes Neysa Caron PARAS, DO  amphetamine -dextroamphetamine  (ADDERALL) 10 MG tablet Take 1 tablet (10 mg total) by mouth daily. 03/11/24   Breeback, Jade L, PA-C  amphetamine -dextroamphetamine  (ADDERALL) 10 MG tablet Take 1 tablet (10 mg total) by mouth daily. 04/10/24   Breeback, Jade L, PA-C  amphetamine -dextroamphetamine  (ADDERALL) 10 MG tablet Take 1 tablet (10 mg total) by mouth daily. 05/10/24   Breeback, Jade L, PA-C  amphetamine -dextroamphetamine  (ADDERALL) 10 MG tablet Take 1 tablet (10 mg total) by mouth daily. 05/01/24   Alvan Dorothyann JONETTA, MD  amphetamine -dextroamphetamine  (ADDERALL) 10 MG tablet Take 1 tablet (10 mg total) by mouth daily. 05/27/24   Breeback, Jade L, PA-C  amphetamine -dextroamphetamine  (ADDERALL) 10 MG tablet Take 1 tablet (10 mg total) by mouth daily at 1pm. 06/26/24 06/26/24   Breeback, Jade L, PA-C  buPROPion  (WELLBUTRIN  XL) 300 MG 24 hr tablet Take 1 tablet (300 mg total) by mouth daily. 11/27/23   Breeback, Jade L, PA-C  Fezolinetant  (VEOZAH ) 45 MG TABS Take 1 tablet (45 mg total) by mouth daily. 09/29/23   Breeback, Jade L, PA-C  levothyroxine  (SYNTHROID ) 112 MCG tablet Take 1 tablet (112 mcg total) by mouth daily  before breakfast. 06/25/24   Breeback, Jade L, PA-C  lisdexamfetamine (VYVANSE ) 70 MG capsule Take 1 capsule (70 mg total) by mouth daily. 04/21/24   Breeback, Jade L, PA-C  lisdexamfetamine (VYVANSE ) 70 MG capsule Take 1 capsule (70 mg total) by mouth daily. 03/11/24   Breeback, Jade L, PA-C  lisdexamfetamine (VYVANSE ) 70 MG capsule Take 1 capsule (70 mg total) by mouth daily. 04/10/24   Breeback, Jade L, PA-C  lisdexamfetamine (VYVANSE ) 70 MG capsule Take 1 capsule (70 mg total) by mouth daily. 05/10/24   Breeback, Jade L, PA-C  lisdexamfetamine (VYVANSE ) 70 MG capsule Take 1 capsule (70 mg total) by mouth daily. 05/01/24   Alvan Dorothyann JONETTA, MD  lisdexamfetamine (VYVANSE ) 70 MG capsule Take 1 capsule (70 mg total) by mouth daily. 05/27/24   Breeback, Jade L, PA-C  lisdexamfetamine (VYVANSE ) 70 MG capsule Take 1 capsule (70 mg total) by mouth daily. 06/26/24 06/26/24   Breeback, Jade L, PA-C  omeprazole  (PRILOSEC) 40 MG capsule Take 1 capsule (40 mg total) by mouth daily. 07/09/24   Breeback, Jade L, PA-C  Vilazodone  HCl (VIIBRYD ) 40 MG TABS Take 1 tablet (40 mg total) by mouth daily. 02/01/24   Breeback, Jade L, PA-C    Allergies: Trintellix  [vortioxetine ]    Review of Systems  Eyes:  Positive for pain.    Updated Vital Signs BP (!) 163/102 (BP Location: Right Arm)   Pulse 65   Temp 98.3 F (36.8 C) (Oral)   Resp  16   Ht 5' 6 (1.676 m)   Wt 75.3 kg   SpO2 99%   BMI 26.79 kg/m   Physical Exam Vitals and nursing note reviewed.  HENT:     Head: Normocephalic.     Mouth/Throat:     Mouth: Mucous membranes are moist.  Eyes:     Comments: No foreign body present.  Right eye with conjunctivitis.  Pupils equal round reactive to light.  Full EOM.  No afferent pupillary defect.  Tetracaine  with relief of pain.  Fluorescein  did show central area of uptake over pupil.  No Seidel sign  Neurological:     Mental Status: She is alert.     (all labs ordered are listed, but only abnormal results  are displayed) Labs Reviewed - No data to display  EKG: None  Radiology: No results found.   Procedures   Medications Ordered in the ED  fluorescein  ophthalmic strip 1 strip (1 strip Both Eyes Given 07/18/24 0915)  tetracaine  (PONTOCAINE) 0.5 % ophthalmic solution 2 drop (2 drops Left Eye Given 07/18/24 0806)  ibuprofen  (ADVIL ) tablet 600 mg (600 mg Oral Given 07/18/24 9192)                                    Medical Decision Making PT-year-old female presenting emergency department with eye pain.  Vital signs reassuring.  Physical exam with corneal abrasion.  Otherwise reassuring eye exam.  Will discharge with erythromycin  ointment.  Discussed ophthalmology follow-up.  Stable for discharge this time  Risk Prescription drug management.      Final diagnoses:  Abrasion of right cornea, initial encounter    ED Discharge Orders          Ordered    erythromycin  ophthalmic ointment        07/18/24 0823               Neysa Caron PARAS, DO 07/18/24 1440

## 2024-07-19 DIAGNOSIS — S0501XA Injury of conjunctiva and corneal abrasion without foreign body, right eye, initial encounter: Secondary | ICD-10-CM | POA: Diagnosis not present

## 2024-07-22 ENCOUNTER — Other Ambulatory Visit: Payer: Self-pay

## 2024-07-22 ENCOUNTER — Other Ambulatory Visit: Payer: Self-pay | Admitting: Physician Assistant

## 2024-07-22 DIAGNOSIS — F900 Attention-deficit hyperactivity disorder, predominantly inattentive type: Secondary | ICD-10-CM

## 2024-07-23 ENCOUNTER — Other Ambulatory Visit: Payer: Self-pay

## 2024-07-23 ENCOUNTER — Other Ambulatory Visit (HOSPITAL_COMMUNITY): Payer: Self-pay

## 2024-08-06 ENCOUNTER — Encounter: Payer: Self-pay | Admitting: Sports Medicine

## 2024-08-07 ENCOUNTER — Other Ambulatory Visit: Payer: Self-pay

## 2024-08-07 ENCOUNTER — Other Ambulatory Visit (HOSPITAL_COMMUNITY): Payer: Self-pay

## 2024-08-07 ENCOUNTER — Other Ambulatory Visit: Payer: Self-pay | Admitting: Physician Assistant

## 2024-08-07 DIAGNOSIS — F900 Attention-deficit hyperactivity disorder, predominantly inattentive type: Secondary | ICD-10-CM

## 2024-08-09 ENCOUNTER — Other Ambulatory Visit (HOSPITAL_COMMUNITY): Payer: Self-pay

## 2024-08-09 ENCOUNTER — Encounter (HOSPITAL_COMMUNITY): Payer: Self-pay

## 2024-08-09 MED ORDER — LISDEXAMFETAMINE DIMESYLATE 70 MG PO CAPS
70.0000 mg | ORAL_CAPSULE | Freq: Every day | ORAL | 0 refills | Status: AC
  Filled 2024-09-18 (×2): qty 30, 30d supply, fill #0

## 2024-08-09 MED ORDER — AMPHETAMINE-DEXTROAMPHETAMINE 10 MG PO TABS
10.0000 mg | ORAL_TABLET | Freq: Every day | ORAL | 0 refills | Status: AC
  Filled 2024-10-17 – 2024-12-25 (×3): qty 30, 30d supply, fill #0

## 2024-08-09 MED ORDER — AMPHETAMINE-DEXTROAMPHETAMINE 10 MG PO TABS
10.0000 mg | ORAL_TABLET | Freq: Every day | ORAL | 0 refills | Status: DC
  Filled 2024-08-09 – 2024-08-21 (×3): qty 30, 30d supply, fill #0

## 2024-08-09 MED ORDER — AMPHETAMINE-DEXTROAMPHETAMINE 10 MG PO TABS
10.0000 mg | ORAL_TABLET | Freq: Every day | ORAL | 0 refills | Status: AC
  Filled 2024-09-18 (×2): qty 30, 30d supply, fill #0

## 2024-08-09 MED ORDER — LISDEXAMFETAMINE DIMESYLATE 70 MG PO CAPS
70.0000 mg | ORAL_CAPSULE | Freq: Every day | ORAL | 0 refills | Status: DC
  Filled 2024-08-09 – 2024-08-21 (×3): qty 30, 30d supply, fill #0

## 2024-08-09 MED ORDER — LISDEXAMFETAMINE DIMESYLATE 70 MG PO CAPS
70.0000 mg | ORAL_CAPSULE | Freq: Every day | ORAL | 0 refills | Status: AC
  Filled 2024-10-17 – 2024-12-25 (×3): qty 30, 30d supply, fill #0

## 2024-08-20 ENCOUNTER — Other Ambulatory Visit: Payer: Self-pay

## 2024-08-20 ENCOUNTER — Other Ambulatory Visit (HOSPITAL_COMMUNITY): Payer: Self-pay

## 2024-08-21 ENCOUNTER — Other Ambulatory Visit (HOSPITAL_COMMUNITY): Payer: Self-pay

## 2024-09-02 ENCOUNTER — Other Ambulatory Visit: Payer: Self-pay

## 2024-09-02 ENCOUNTER — Other Ambulatory Visit: Payer: Self-pay | Admitting: Physician Assistant

## 2024-09-02 DIAGNOSIS — F900 Attention-deficit hyperactivity disorder, predominantly inattentive type: Secondary | ICD-10-CM

## 2024-09-04 ENCOUNTER — Other Ambulatory Visit (HOSPITAL_COMMUNITY): Payer: Self-pay

## 2024-09-18 ENCOUNTER — Other Ambulatory Visit (HOSPITAL_COMMUNITY): Payer: Self-pay

## 2024-09-18 ENCOUNTER — Other Ambulatory Visit: Payer: Self-pay | Admitting: Physician Assistant

## 2024-09-18 ENCOUNTER — Other Ambulatory Visit: Payer: Self-pay

## 2024-09-18 DIAGNOSIS — F900 Attention-deficit hyperactivity disorder, predominantly inattentive type: Secondary | ICD-10-CM

## 2024-09-18 MED ORDER — AMPHETAMINE-DEXTROAMPHETAMINE 10 MG PO TABS
10.0000 mg | ORAL_TABLET | Freq: Every day | ORAL | 0 refills | Status: AC
  Filled 2024-11-20: qty 30, 30d supply, fill #0

## 2024-09-18 MED ORDER — LISDEXAMFETAMINE DIMESYLATE 70 MG PO CAPS
70.0000 mg | ORAL_CAPSULE | Freq: Every day | ORAL | 0 refills | Status: AC
  Filled 2024-09-18 – 2024-11-20 (×3): qty 30, 30d supply, fill #0

## 2024-09-18 MED ORDER — AMPHETAMINE-DEXTROAMPHETAMINE 10 MG PO TABS
10.0000 mg | ORAL_TABLET | Freq: Every day | ORAL | 0 refills | Status: AC
  Filled 2024-09-18 – 2024-11-20 (×3): qty 30, 30d supply, fill #0

## 2024-09-18 MED ORDER — LISDEXAMFETAMINE DIMESYLATE 70 MG PO CAPS
70.0000 mg | ORAL_CAPSULE | Freq: Every day | ORAL | 0 refills | Status: AC
  Filled 2024-11-20: qty 30, 30d supply, fill #0

## 2024-09-18 NOTE — Telephone Encounter (Signed)
 Duplicate request

## 2024-09-20 ENCOUNTER — Other Ambulatory Visit: Payer: Self-pay | Admitting: Physician Assistant

## 2024-09-20 DIAGNOSIS — Z1231 Encounter for screening mammogram for malignant neoplasm of breast: Secondary | ICD-10-CM

## 2024-09-26 ENCOUNTER — Ambulatory Visit

## 2024-09-26 DIAGNOSIS — Z1231 Encounter for screening mammogram for malignant neoplasm of breast: Secondary | ICD-10-CM | POA: Diagnosis not present

## 2024-10-01 ENCOUNTER — Ambulatory Visit: Payer: Self-pay | Admitting: Physician Assistant

## 2024-10-01 ENCOUNTER — Ambulatory Visit: Admitting: Physician Assistant

## 2024-10-01 VITALS — BP 110/72 | HR 70 | Ht 66.0 in | Wt 152.0 lb

## 2024-10-01 DIAGNOSIS — E039 Hypothyroidism, unspecified: Secondary | ICD-10-CM

## 2024-10-01 DIAGNOSIS — E7800 Pure hypercholesterolemia, unspecified: Secondary | ICD-10-CM | POA: Diagnosis not present

## 2024-10-01 DIAGNOSIS — E782 Mixed hyperlipidemia: Secondary | ICD-10-CM

## 2024-10-01 DIAGNOSIS — R7303 Prediabetes: Secondary | ICD-10-CM | POA: Diagnosis not present

## 2024-10-01 DIAGNOSIS — Z23 Encounter for immunization: Secondary | ICD-10-CM | POA: Diagnosis not present

## 2024-10-01 DIAGNOSIS — Z0000 Encounter for general adult medical examination without abnormal findings: Secondary | ICD-10-CM

## 2024-10-01 NOTE — Progress Notes (Signed)
 Complete physical exam  Patient: Michelle Browning   DOB: Dec 11, 1970   53 y.o. Female  MRN: [id number]  Subjective:    No chief complaint on file.   Michelle Browning is a 53 y.o. female who presents today for a complete physical exam. She reports consuming a general diet. She noticed her appetite has decreased significantly due to recent stressors, but is working to build it back to normal. Pt states she has not been exercising like she is supposed to, but plans to begin back soon. She generally feels fairly well. She reports sleeping issues due to stressors. She does not have additional problems to discuss today.    Most recent fall risk assessment:    10/07/2024    7:08 AM  Fall Risk   Falls in the past year? 0  Number falls in past yr: 0  Injury with Fall? 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   OCD (obsessive compulsive disorder) 01/27/2016   Attention deficit hyperactivity disorder (ADHD) 01/27/2016   Class 1 obesity due to excess calories without serious comorbidity with body mass index (BMI) of 30.0 to 30.9 in adult 12/25/2015   Inattention 12/25/2015   Overweight (BMI 25.0-29.9) 03/29/2015   Decreased thyroid  stimulating hormone level 06/11/2014   Hyperlipidemia 05/26/2014   Hypothyroidism 02/20/2013   Thyroid  cancer (HCC) 02/07/2012   Anxiety and depression 02/07/2012   Fatigue 02/07/2012    Past Medical History:  Diagnosis Date   Cancer (HCC) 2002   Thyroid  cancer   Depression    GERD (gastroesophageal reflux disease)    TUMS PRN   Hyperlipidemia    diet controled - no meds   Personal history of radiation therapy 2003   3 treatments of radioactive Iodine treatments 2003,2004,2006   Thyroid  disease    Past Surgical History:  Procedure Laterality Date   THYROIDECTOMY  2002   Family History  Problem Relation Age of Onset   Heart attack Mother    Breast cancer Maternal Aunt    Breast cancer Maternal Aunt    Breast cancer Paternal Aunt    Breast cancer Paternal Aunt    Breast cancer Paternal Aunt    Colon cancer Neg Hx    Colon polyps Neg Hx    Esophageal cancer Neg Hx    Rectal cancer Neg Hx    Stomach cancer Neg Hx    Allergies  Allergen Reactions   Trintellix  [Vortioxetine ]     nausea      Patient Care Team: Lillyanna Glandon L, PA-C as PCP - General (Family Medicine)   Outpatient Medications Prior to Visit  Medication Sig   amphetamine -dextroamphetamine  (ADDERALL) 10 MG tablet Take 1 tablet (10 mg total) by mouth daily.   amphetamine -dextroamphetamine  (ADDERALL) 10 MG tablet Take 1 tablet (10 mg total) by mouth daily.   amphetamine -dextroamphetamine  (  ADDERALL) 10 MG tablet Take 1 tablet (10 mg total) by mouth daily at 1pm.   [START ON 10/08/2024] amphetamine -dextroamphetamine  (ADDERALL) 10 MG tablet Take 1 tablet (10 mg total) by mouth daily at 1pm. 06/26/24   amphetamine -dextroamphetamine  (ADDERALL) 10 MG tablet Take 1 tablet (10 mg total) by mouth daily.   [START ON 11/17/2024] amphetamine -dextroamphetamine  (ADDERALL) 10 MG tablet Take 1 tablet (10 mg total) by mouth daily.   [START ON 10/18/2024] amphetamine -dextroamphetamine  (ADDERALL) 10 MG tablet Take 1 tablet (10 mg total) by mouth daily.   buPROPion  (WELLBUTRIN  XL) 300 MG 24 hr tablet Take 1 tablet (300 mg total) by mouth daily.   levothyroxine  (SYNTHROID ) 112 MCG tablet Take 1 tablet (112 mcg  total) by mouth daily before breakfast.   lisdexamfetamine (VYVANSE ) 70 MG capsule Take 1 capsule (70 mg total) by mouth daily.   lisdexamfetamine (VYVANSE ) 70 MG capsule Take 1 capsule (70 mg total) by mouth daily.   lisdexamfetamine (VYVANSE ) 70 MG capsule Take 1 capsule (70 mg total) by mouth daily. 06/26/24   lisdexamfetamine (VYVANSE ) 70 MG capsule Take 1 capsule (70 mg total) by mouth daily.   [START ON 10/08/2024] lisdexamfetamine (VYVANSE ) 70 MG capsule Take 1 capsule (70 mg total) by mouth daily.   lisdexamfetamine (VYVANSE ) 70 MG capsule Take 1 capsule (70 mg total) by mouth daily.   [START ON 10/18/2024] lisdexamfetamine (VYVANSE ) 70 MG capsule Take 1 capsule (70 mg total) by mouth daily.   [START ON 11/17/2024] lisdexamfetamine (VYVANSE ) 70 MG capsule Take 1 capsule (70 mg total) by mouth daily.   omeprazole  (PRILOSEC) 40 MG capsule Take 1 capsule (40 mg total) by mouth daily.   Vilazodone  HCl (VIIBRYD ) 40 MG TABS Take 1 tablet (40 mg total) by mouth daily.   [DISCONTINUED] erythromycin  ophthalmic ointment Place a 1/2 inch ribbon of ointment into the lower eyelid three times daily.   [DISCONTINUED] Fezolinetant  (VEOZAH ) 45 MG TABS Take 1 tablet (45 mg total) by mouth daily.   No facility-administered medications prior to visit.    Review of Systems  Constitutional:  Positive for weight loss.  Cardiovascular:  Negative for chest pain and palpitations.  Gastrointestinal:  Negative for constipation and diarrhea.       Patient states her appetite has decreased.   Psychiatric/Behavioral:  The patient is nervous/anxious.        Patient is positive for increased stressed due to personal life stressors.  All other systems reviewed and are negative.         Objective:     BP 110/72   Pulse 70   Ht 5' 6 (1.676 m)   Wt 152 lb (68.9 kg)   SpO2 99%   BMI 24.53 kg/m  BP Readings from Last 3 Encounters:  10/01/24 110/72  07/18/24 (!) 163/102  04/19/24 (!) 128/90   Wt  Readings from Last 3 Encounters:  10/01/24 152 lb (68.9 kg)  07/18/24 166 lb 0.1 oz (75.3 kg)  04/19/24 166 lb (75.3 kg)      Physical Exam Constitutional:      Appearance: Normal appearance. She is normal weight.  HENT:     Head: Normocephalic and atraumatic.     Right Ear: Tympanic membrane, ear canal and external ear normal.     Left Ear: Tympanic membrane, ear canal and external ear normal.     Mouth/Throat:     Mouth: Mucous membranes are moist.     Pharynx: Oropharynx is clear. No oropharyngeal exudate or posterior oropharyngeal erythema.  Eyes:  Extraocular Movements: Extraocular movements intact.     Conjunctiva/sclera: Conjunctivae normal.     Pupils: Pupils are equal, round, and reactive to light.  Cardiovascular:     Rate and Rhythm: Normal rate and regular rhythm.     Pulses: Normal pulses.     Heart sounds: Normal heart sounds. No murmur heard.    No friction rub. No gallop.  Pulmonary:     Effort: Pulmonary effort is normal.     Breath sounds: No stridor. No wheezing or rales.  Abdominal:     General: Abdomen is flat. Bowel sounds are normal.     Palpations: Abdomen is soft.  Musculoskeletal:     Cervical back: Normal range of motion.  Lymphadenopathy:     Cervical: Cervical adenopathy present.  Skin:    General: Skin is warm and dry.  Neurological:     General: No focal deficit present.     Mental Status: She is alert and oriented to person, place, and time. Mental status is at baseline.  Psychiatric:        Behavior: Behavior normal.        Thought Content: Thought content normal.        Judgment: Judgment normal.     Comments: Mood is slightly sad due to personal stressors.            Assessment & Plan:    Routine Health Maintenance and Physical Exam  Immunization History  Administered Date(s) Administered   Influenza, Seasonal, Injecte, Preservative Fre 09/29/2023   Influenza,inj,Quad PF,6+ Mos 08/04/2017, 08/08/2018, 08/13/2019,  08/05/2020, 08/18/2021, 09/05/2022   Influenza-Unspecified 08/30/2024   PFIZER(Purple Top)SARS-COV-2 Vaccination 12/27/2019, 01/18/2020, 12/06/2020, 05/05/2021   PNEUMOCOCCAL CONJUGATE-20 10/01/2024   Zoster Recombinant(Shingrix ) 09/29/2023, 10/01/2024    Health Maintenance  Topic Date Due   Hepatitis B Vaccines 19-59 Average Risk (1 of 3 - 19+ 3-dose series) Never done   Colonoscopy  12/08/2024   COVID-19 Vaccine (5 - 2025-26 season) 10/17/2024 (Originally 08/05/2024)   Cervical Cancer Screening (HPV/Pap Cotest)  05/25/2025   Mammogram  09/26/2025   Pneumococcal Vaccine: 50+ Years  Completed   Influenza Vaccine  Completed   Hepatitis C Screening  Completed   HIV Screening  Completed   Zoster Vaccines- Shingrix   Completed   HPV VACCINES  Aged Out   Meningococcal B Vaccine  Aged Out   DTaP/Tdap/Td  Discontinued    Discussed health benefits of physical activity, and encouraged her to engage in regular exercise appropriate for her age and condition. SABRA.Diagnoses and all orders for this visit:  Encounter for annual physical exam -     CBC with Differential/Platelet -     CMP14+EGFR -     Lipid panel -     VITAMIN D  25 Hydroxy (Vit-D Deficiency, Fractures) -     Vitamin B12 -     TSH + free T4 -     HgB A1c  Immunization due -     Pneumococcal conjugate vaccine 20-valent (Prevnar 20)  Acquired hypothyroidism -     TSH + free T4  Mixed hyperlipidemia  Pre-diabetes -     CMP14+EGFR -     HgB A1c  Elevated LDL cholesterol level -     Lipid panel  Need for shingles vaccine -     Zoster Recombinant (Shingrix  )    Discussed upcoming colonoscopy screening due in January 2026.  Mammogram and PAP UTD Encouraged daily vitamin D  and calcium supplements Flu shot UTD Received second Shingrix  and Pneumococcal  vaccine today Will contact pt with lab results  Vitals look great today Discussed mood and life stressors today with recommendations for natural anti-anxiety  remedies. Try to get in mor exercise, goal 150 minutes a week Hypothyroidism - TSH ordered and will adjust accordingly ADHD - will refill as needed Does not look like she needs refills of medication today will refill accordingly If symptoms worsen or any problems arise, please contact the office.    Return in about 1 year (around 10/01/2025).     Gearold Wainer, PA-C

## 2024-10-01 NOTE — Progress Notes (Signed)
 Normal mammogram. Follow up in 1 year.

## 2024-10-01 NOTE — Patient Instructions (Signed)

## 2024-10-02 ENCOUNTER — Ambulatory Visit: Payer: Self-pay | Admitting: Physician Assistant

## 2024-10-02 LAB — CMP14+EGFR
ALT: 13 IU/L (ref 0–32)
AST: 17 IU/L (ref 0–40)
Albumin: 4.3 g/dL (ref 3.8–4.9)
Alkaline Phosphatase: 87 IU/L (ref 49–135)
BUN/Creatinine Ratio: 11 (ref 9–23)
BUN: 12 mg/dL (ref 6–24)
Bilirubin Total: 0.6 mg/dL (ref 0.0–1.2)
CO2: 21 mmol/L (ref 20–29)
Calcium: 9.5 mg/dL (ref 8.7–10.2)
Chloride: 102 mmol/L (ref 96–106)
Creatinine, Ser: 1.09 mg/dL — ABNORMAL HIGH (ref 0.57–1.00)
Globulin, Total: 2.8 g/dL (ref 1.5–4.5)
Glucose: 89 mg/dL (ref 70–99)
Potassium: 4.2 mmol/L (ref 3.5–5.2)
Sodium: 140 mmol/L (ref 134–144)
Total Protein: 7.1 g/dL (ref 6.0–8.5)
eGFR: 61 mL/min/1.73 (ref >59)

## 2024-10-02 LAB — CBC WITH DIFFERENTIAL/PLATELET
Basophils Absolute: 0.1 x10E3/uL (ref 0.0–0.2)
Basos: 1 %
EOS (ABSOLUTE): 0.1 x10E3/uL (ref 0.0–0.4)
Eos: 2 %
Hematocrit: 42.1 % (ref 34.0–46.6)
Hemoglobin: 13.9 g/dL (ref 11.1–15.9)
Immature Grans (Abs): 0 x10E3/uL (ref 0.0–0.1)
Immature Granulocytes: 0 %
Lymphocytes Absolute: 1.9 x10E3/uL (ref 0.7–3.1)
Lymphs: 26 %
MCH: 30.2 pg (ref 26.6–33.0)
MCHC: 33 g/dL (ref 31.5–35.7)
MCV: 92 fL (ref 79–97)
Monocytes Absolute: 0.6 x10E3/uL (ref 0.1–0.9)
Monocytes: 8 %
Neutrophils Absolute: 4.5 x10E3/uL (ref 1.4–7.0)
Neutrophils: 63 %
Platelets: 277 x10E3/uL (ref 150–450)
RBC: 4.6 x10E6/uL (ref 3.77–5.28)
RDW: 12.2 % (ref 11.7–15.4)
WBC: 7.3 x10E3/uL (ref 3.4–10.8)

## 2024-10-02 LAB — TSH+FREE T4
Free T4: 1.58 ng/dL (ref 0.82–1.77)
TSH: 0.074 u[IU]/mL — ABNORMAL LOW (ref 0.450–4.500)

## 2024-10-02 LAB — HEMOGLOBIN A1C
Est. average glucose Bld gHb Est-mCnc: 100 mg/dL
Hgb A1c MFr Bld: 5.1 % (ref 4.8–5.6)

## 2024-10-02 LAB — LIPID PANEL
Chol/HDL Ratio: 3 ratio (ref 0.0–4.4)
Cholesterol, Total: 260 mg/dL — ABNORMAL HIGH (ref 100–199)
HDL: 86 mg/dL (ref >39)
LDL Chol Calc (NIH): 161 mg/dL — ABNORMAL HIGH (ref 0–99)
Triglycerides: 80 mg/dL (ref 0–149)
VLDL Cholesterol Cal: 13 mg/dL (ref 5–40)

## 2024-10-02 LAB — VITAMIN D 25 HYDROXY (VIT D DEFICIENCY, FRACTURES): Vit D, 25-Hydroxy: 32.1 ng/mL (ref 30.0–100.0)

## 2024-10-02 LAB — VITAMIN B12: Vitamin B-12: 978 pg/mL (ref 232–1245)

## 2024-10-02 NOTE — Progress Notes (Signed)
 Rayme,   A1C is normal. No diabetes.  B12 normal.  Kidney function improved some.  Vitamin D  improved but not to goal. Increase vitamin D  intake by 12-1998 units daily.  TSH low but free level is normal. We could decrease synthroid  dose or leave the same for now, recheck in 3-6 months.  TG improved but LDL still the same. Keep working on exercise and diet, recheck in 1 year. What are your thoughts on low dose statin to get LDL closer to goal?

## 2024-10-07 ENCOUNTER — Encounter: Payer: Self-pay | Admitting: Physician Assistant

## 2024-10-17 ENCOUNTER — Other Ambulatory Visit: Payer: Self-pay | Admitting: Physician Assistant

## 2024-10-17 DIAGNOSIS — F900 Attention-deficit hyperactivity disorder, predominantly inattentive type: Secondary | ICD-10-CM

## 2024-10-18 ENCOUNTER — Other Ambulatory Visit: Payer: Self-pay

## 2024-10-18 ENCOUNTER — Other Ambulatory Visit (HOSPITAL_COMMUNITY): Payer: Self-pay

## 2024-11-20 ENCOUNTER — Other Ambulatory Visit: Payer: Self-pay

## 2024-11-20 ENCOUNTER — Other Ambulatory Visit: Payer: Self-pay | Admitting: Family Medicine

## 2024-11-20 ENCOUNTER — Other Ambulatory Visit (HOSPITAL_COMMUNITY): Payer: Self-pay

## 2024-11-20 ENCOUNTER — Other Ambulatory Visit: Payer: Self-pay | Admitting: Physician Assistant

## 2024-11-20 DIAGNOSIS — F900 Attention-deficit hyperactivity disorder, predominantly inattentive type: Secondary | ICD-10-CM

## 2024-11-20 DIAGNOSIS — F419 Anxiety disorder, unspecified: Secondary | ICD-10-CM

## 2024-11-20 DIAGNOSIS — F422 Mixed obsessional thoughts and acts: Secondary | ICD-10-CM

## 2024-11-20 DIAGNOSIS — F330 Major depressive disorder, recurrent, mild: Secondary | ICD-10-CM

## 2024-11-22 ENCOUNTER — Other Ambulatory Visit (HOSPITAL_COMMUNITY): Payer: Self-pay

## 2024-11-22 MED ORDER — BUPROPION HCL ER (XL) 300 MG PO TB24
300.0000 mg | ORAL_TABLET | Freq: Every day | ORAL | 3 refills | Status: AC
  Filled 2024-11-22 – 2024-12-13 (×2): qty 90, 90d supply, fill #0

## 2024-11-22 MED ORDER — LISDEXAMFETAMINE DIMESYLATE 70 MG PO CAPS
70.0000 mg | ORAL_CAPSULE | Freq: Every day | ORAL | 0 refills | Status: AC
  Filled 2024-11-22 – 2025-01-07 (×2): qty 30, 30d supply, fill #0

## 2024-11-22 MED ORDER — AMPHETAMINE-DEXTROAMPHETAMINE 10 MG PO TABS
10.0000 mg | ORAL_TABLET | Freq: Every day | ORAL | 0 refills | Status: AC
  Filled 2024-11-22: qty 30, 30d supply, fill #0

## 2024-11-22 NOTE — Telephone Encounter (Signed)
 Amphetamine -dex and Lisdexamfetamine  renewed on 11/17/24.

## 2024-11-29 ENCOUNTER — Other Ambulatory Visit (HOSPITAL_COMMUNITY): Payer: Self-pay

## 2024-12-13 ENCOUNTER — Other Ambulatory Visit: Payer: Self-pay

## 2024-12-13 ENCOUNTER — Other Ambulatory Visit (HOSPITAL_COMMUNITY): Payer: Self-pay

## 2024-12-16 ENCOUNTER — Other Ambulatory Visit: Payer: Self-pay

## 2024-12-16 ENCOUNTER — Encounter: Payer: Self-pay | Admitting: Pharmacist

## 2024-12-19 ENCOUNTER — Other Ambulatory Visit: Payer: Self-pay

## 2024-12-20 ENCOUNTER — Other Ambulatory Visit (HOSPITAL_COMMUNITY): Payer: Self-pay

## 2024-12-25 ENCOUNTER — Other Ambulatory Visit (HOSPITAL_COMMUNITY): Payer: Self-pay

## 2024-12-25 ENCOUNTER — Encounter: Payer: Self-pay | Admitting: Pharmacy Technician

## 2024-12-25 ENCOUNTER — Other Ambulatory Visit: Payer: Self-pay

## 2024-12-27 ENCOUNTER — Other Ambulatory Visit: Payer: Self-pay

## 2024-12-30 ENCOUNTER — Other Ambulatory Visit: Payer: Self-pay

## 2025-01-01 ENCOUNTER — Other Ambulatory Visit: Payer: Self-pay

## 2025-01-07 ENCOUNTER — Other Ambulatory Visit (HOSPITAL_COMMUNITY): Payer: Self-pay

## 2025-07-07 ENCOUNTER — Ambulatory Visit: Admitting: Dermatology

## 2025-10-03 ENCOUNTER — Encounter: Admitting: Physician Assistant
